# Patient Record
Sex: Female | Born: 1965 | Race: White | Hispanic: No | Marital: Married | Smoking: Never smoker
Health system: Southern US, Community
[De-identification: ages and names within clinical notes are randomized; demographics above are authoritative.]

## PROBLEM LIST (undated history)

## (undated) DIAGNOSIS — E559 Vitamin D deficiency, unspecified: Secondary | ICD-10-CM

## (undated) DIAGNOSIS — E039 Hypothyroidism, unspecified: Secondary | ICD-10-CM

## (undated) DIAGNOSIS — D649 Anemia, unspecified: Secondary | ICD-10-CM

## (undated) DIAGNOSIS — R160 Hepatomegaly, not elsewhere classified: Secondary | ICD-10-CM

## (undated) DIAGNOSIS — E119 Type 2 diabetes mellitus without complications: Secondary | ICD-10-CM

## (undated) DIAGNOSIS — L729 Follicular cyst of the skin and subcutaneous tissue, unspecified: Secondary | ICD-10-CM

## (undated) DIAGNOSIS — R768 Other specified abnormal immunological findings in serum: Secondary | ICD-10-CM

## (undated) DIAGNOSIS — L089 Local infection of the skin and subcutaneous tissue, unspecified: Secondary | ICD-10-CM

## (undated) DIAGNOSIS — M1990 Unspecified osteoarthritis, unspecified site: Secondary | ICD-10-CM

## (undated) DIAGNOSIS — R7689 Other specified abnormal immunological findings in serum: Secondary | ICD-10-CM

## (undated) DIAGNOSIS — Z8619 Personal history of other infectious and parasitic diseases: Secondary | ICD-10-CM

## (undated) HISTORY — DX: Local infection of the skin and subcutaneous tissue, unspecified: L08.9

## (undated) HISTORY — DX: Follicular cyst of the skin and subcutaneous tissue, unspecified: L72.9

## (undated) HISTORY — DX: Vitamin D deficiency, unspecified: E55.9

## (undated) HISTORY — DX: Other specified abnormal immunological findings in serum: R76.89

## (undated) HISTORY — DX: Type 2 diabetes mellitus without complications: E11.9

## (undated) HISTORY — DX: Hepatomegaly, not elsewhere classified: R16.0

## (undated) HISTORY — DX: Anemia, unspecified: D64.9

## (undated) HISTORY — DX: Unspecified osteoarthritis, unspecified site: M19.90

## (undated) HISTORY — DX: Personal history of other infectious and parasitic diseases: Z86.19

## (undated) HISTORY — DX: Other specified abnormal immunological findings in serum: R76.8

---

## 1997-02-25 DIAGNOSIS — L089 Local infection of the skin and subcutaneous tissue, unspecified: Secondary | ICD-10-CM

## 1997-02-25 HISTORY — DX: Local infection of the skin and subcutaneous tissue, unspecified: L08.9

## 1998-12-28 HISTORY — PX: OTHER SURGICAL HISTORY: SHX169

## 2001-02-25 HISTORY — PX: LIPOMA EXCISION: SHX5283

## 2005-04-03 ENCOUNTER — Ambulatory Visit: Payer: Self-pay

## 2005-04-11 ENCOUNTER — Ambulatory Visit: Payer: Self-pay | Admitting: Unknown Physician Specialty

## 2005-06-04 ENCOUNTER — Observation Stay: Payer: Self-pay | Admitting: Unknown Physician Specialty

## 2005-06-11 ENCOUNTER — Observation Stay: Payer: Self-pay | Admitting: Unknown Physician Specialty

## 2005-06-21 ENCOUNTER — Observation Stay: Payer: Self-pay | Admitting: Obstetrics & Gynecology

## 2005-06-28 ENCOUNTER — Observation Stay: Payer: Self-pay | Admitting: Unknown Physician Specialty

## 2005-07-03 ENCOUNTER — Inpatient Hospital Stay: Payer: Self-pay | Admitting: Unknown Physician Specialty

## 2005-07-11 ENCOUNTER — Ambulatory Visit: Payer: Self-pay | Admitting: Unknown Physician Specialty

## 2006-01-22 ENCOUNTER — Ambulatory Visit: Payer: Self-pay | Admitting: Unknown Physician Specialty

## 2007-03-19 ENCOUNTER — Ambulatory Visit: Payer: Self-pay | Admitting: Unknown Physician Specialty

## 2007-08-08 ENCOUNTER — Ambulatory Visit: Payer: Self-pay | Admitting: Rheumatology

## 2008-03-22 ENCOUNTER — Ambulatory Visit: Payer: Self-pay | Admitting: Unknown Physician Specialty

## 2009-03-23 ENCOUNTER — Ambulatory Visit: Payer: Self-pay | Admitting: Unknown Physician Specialty

## 2009-03-28 ENCOUNTER — Ambulatory Visit: Payer: Self-pay | Admitting: Unknown Physician Specialty

## 2010-04-04 ENCOUNTER — Ambulatory Visit: Payer: Self-pay | Admitting: Unknown Physician Specialty

## 2010-08-14 ENCOUNTER — Ambulatory Visit: Payer: Self-pay | Admitting: Family Medicine

## 2010-08-14 DIAGNOSIS — Z87898 Personal history of other specified conditions: Secondary | ICD-10-CM | POA: Insufficient documentation

## 2010-11-05 ENCOUNTER — Ambulatory Visit: Payer: Self-pay | Admitting: Orthopedic Surgery

## 2010-11-16 ENCOUNTER — Ambulatory Visit: Payer: Self-pay | Admitting: Family Medicine

## 2011-04-08 ENCOUNTER — Ambulatory Visit: Payer: Self-pay | Admitting: Unknown Physician Specialty

## 2012-03-03 LAB — HEPATIC FUNCTION PANEL: AST: 64 U/L — AB (ref 13–35)

## 2012-03-09 ENCOUNTER — Ambulatory Visit: Payer: Self-pay | Admitting: Family Medicine

## 2012-03-09 DIAGNOSIS — R160 Hepatomegaly, not elsewhere classified: Secondary | ICD-10-CM | POA: Insufficient documentation

## 2012-03-09 HISTORY — PX: OTHER SURGICAL HISTORY: SHX169

## 2013-11-29 ENCOUNTER — Encounter: Payer: Self-pay | Admitting: Podiatry

## 2013-11-29 ENCOUNTER — Ambulatory Visit (INDEPENDENT_AMBULATORY_CARE_PROVIDER_SITE_OTHER): Payer: 59

## 2013-11-29 ENCOUNTER — Ambulatory Visit (INDEPENDENT_AMBULATORY_CARE_PROVIDER_SITE_OTHER): Payer: 59 | Admitting: Podiatry

## 2013-11-29 VITALS — BP 148/89 | HR 88 | Resp 16 | Ht 60.0 in | Wt 200.0 lb

## 2013-11-29 DIAGNOSIS — B353 Tinea pedis: Secondary | ICD-10-CM

## 2013-11-29 DIAGNOSIS — M778 Other enthesopathies, not elsewhere classified: Secondary | ICD-10-CM

## 2013-11-29 DIAGNOSIS — M779 Enthesopathy, unspecified: Secondary | ICD-10-CM

## 2013-11-29 DIAGNOSIS — M7752 Other enthesopathy of left foot: Secondary | ICD-10-CM

## 2013-11-29 MED ORDER — TERBINAFINE HCL 250 MG PO TABS: 250.0000 mg | ORAL_TABLET | Freq: Every day | ORAL | Status: DC

## 2013-11-29 NOTE — Progress Notes (Signed)
   Subjective:    Patient ID: Kimberly Hancock, female    DOB: December 31, 1965, 48 y.o.   MRN: [id number]  HPI Comments: Both of my feet are itching. Its the toes. Ive had the itching for 1year. They are getting worse. All of the otc stuff does not work. im having pain under my toes on the left foot. Ive had the pain for 2 - 3 months. The pain has remained the same. When i first get up and walk i feel it. i dont do anything for the pain.     Review of Systems  Musculoskeletal:       Joint pain  All other systems reviewed and are negative.      Objective:   Physical Exam: I have reviewed her past medical history medications allergies surgeries social history and review of systems. Pulses are strongly palpable bilateral. Neurologic sensorium is intact per since once the monofilament. Deep tendon reflexes are intact bilateral muscle strength is 5 over 5 dorsiflexors plantar flexors inverters everters all intrinsic musculature is intact. Orthopedic evaluation demonstrates all joints distal to the ankle a full range of motion with out crepitation that she does have pain on end range of motion of the third metatarsophalangeal joint of the left foot. Cutaneous evaluation demonstrates supple well hydrated cutis no erythema edema cellulitis drainage or odor with exception of the right foot with an interdigital tinea pedis.        Assessment & Plan:  Assessment: Tinea pedis right foot. Capsulitis third metatarsophalangeal joint left foot.  Plan: Injected Kenalog about the third metatarsophalangeal joint of the left foot. Wrote her prescription for Lamisil tablets 250 mg 1 by mouth daily x30. Followup with her in one month.

## 2013-12-27 ENCOUNTER — Ambulatory Visit: Payer: 59 | Admitting: Podiatry

## 2013-12-29 ENCOUNTER — Ambulatory Visit: Payer: 59 | Admitting: Podiatry

## 2014-01-17 ENCOUNTER — Encounter: Payer: Self-pay | Admitting: Podiatry

## 2014-01-17 ENCOUNTER — Ambulatory Visit (INDEPENDENT_AMBULATORY_CARE_PROVIDER_SITE_OTHER): Payer: 59 | Admitting: Podiatry

## 2014-01-17 DIAGNOSIS — B353 Tinea pedis: Secondary | ICD-10-CM

## 2014-01-17 MED ORDER — CLOTRIMAZOLE-BETAMETHASONE 1-0.05 % EX CREA: 1.0000 "application " | TOPICAL_CREAM | Freq: Two times a day (BID) | CUTANEOUS | Status: DC

## 2014-01-17 NOTE — Progress Notes (Signed)
She presents today stating that the metatarsophalangeal joint left foot is doing much better. However she states that the itching has not gotten any better. She took a month's worth of the Lamisil forming and has not improved. She still has what appears to be tinea pedis on the plantar aspect and interdigital areas of her bilateral foot she also has excoriations dorsal aspect of the left foot.  Objective: Vital signs are stable she is alert and oriented times puritis with tinea pedis and resolving MTPJ pain.  Assessment: Well-healing capsulitis tinea pedis bilateral.  Plan: Wrote a prescription for Lotrisone to be applied twice daily follow-up with dermatology if no better.

## 2014-02-16 ENCOUNTER — Ambulatory Visit: Payer: 59 | Admitting: Podiatry

## 2014-05-17 ENCOUNTER — Ambulatory Visit: Payer: Self-pay | Admitting: Unknown Physician Specialty

## 2014-09-08 ENCOUNTER — Encounter: Payer: Self-pay | Admitting: *Deleted

## 2014-09-15 ENCOUNTER — Other Ambulatory Visit: Payer: 59

## 2014-09-15 ENCOUNTER — Ambulatory Visit (INDEPENDENT_AMBULATORY_CARE_PROVIDER_SITE_OTHER): Payer: 59 | Admitting: General Surgery

## 2014-09-15 ENCOUNTER — Encounter: Payer: Self-pay | Admitting: General Surgery

## 2014-09-15 VITALS — BP 152/82 | HR 86 | Resp 14 | Ht 60.0 in | Wt 206.0 lb

## 2014-09-15 DIAGNOSIS — N644 Mastodynia: Secondary | ICD-10-CM

## 2014-09-15 NOTE — Progress Notes (Signed)
Patient ID: Kimberly Hancock, female   DOB: 05-16-1965, 49 y.o.   MRN: [id number]  Chief Complaint  Patient presents with  . Breast Problem    HPI Kimberly Hancock is a 49 y.o. female.  who presents for a breast evaluation. The most recent mammogram was done on 05-17-14.  Patient does not perform regular self breast checks but she gets regular mammograms done.    She states she has noticed left breast pain for 3-4 weeks. Described as a stabbing pain, lasting 5-10 minutes, occurring occasionally at various times. When she dries off in the shower she can feel the pain. Physical activity does not change her symptoms. She has not been awakened from sleep due to breast discomfort. Denies any breast injury or trauma. No family history of breast cancer. She states she can't feel anything different but states Dr. Vernie Ammons could.  Denies any recent diet or caffeine intake changes.   HPI  Past Medical History  Diagnosis Date  . Infected cyst of skin 1999    Past Surgical History  Procedure Laterality Date  . Cesarean section  2007  . Lipoma excision  2003    mid back/ Dr Bary Castilla    Family History  Problem Relation Age of Onset  . Diabetes Mother   . Cancer Brother 30    bone passed age 53    Social History History  Substance Use Topics  . Smoking status: Never Smoker   . Smokeless tobacco: Never Used  . Alcohol Use: No    Allergies  Allergen Reactions  . Amoxicillin Itching  . Sulfa Antibiotics Itching and Swelling    Current Outpatient Prescriptions  Medication Sig Dispense Refill  . Vitamin D, Cholecalciferol, 1000 UNITS CAPS Take by mouth daily.    . vitamin E 400 UNIT capsule Take 400 Units by mouth daily.     No current facility-administered medications for this visit.    Review of Systems Review of Systems  Constitutional: Negative.   Respiratory: Negative.   Cardiovascular: Negative.     Blood pressure 152/82, pulse 86, resp. rate 14, height 5' (1.524 m),  weight 206 lb (93.441 kg), last menstrual period 08/31/2014.  Physical Exam Physical Exam  Constitutional: She is oriented to person, place, and time. She appears well-developed and well-nourished.  HENT:  Mouth/Throat: Oropharynx is clear and moist.  Eyes: Conjunctivae are normal. No scleral icterus.  Neck: Neck supple.  Cardiovascular: Normal rate, regular rhythm and normal heart sounds.   Pulmonary/Chest: Effort normal and breath sounds normal. Right breast exhibits no inverted nipple, no mass, no nipple discharge, no skin change and no tenderness. Left breast exhibits no inverted nipple, no mass, no nipple discharge, no skin change and no tenderness.  Thickening at 6 o'clock just below nipple left breast.  Lymphadenopathy:    She has no cervical adenopathy.    She has no axillary adenopathy.  Neurological: She is alert and oriented to person, place, and time.  Skin: Skin is warm and dry.  Psychiatric: She has a normal mood and affect.    Data Reviewed Screening mammogram completed at [facility] was not available for review. Reported asymmetry in the left breast.  Focal spot compression views and diagnostic studies completed at [facility] dated 05/17/2014 were reviewed. BI-RADS-1.  Ultrasound examination of the left breast especially in the retroareolar area and in the inferior aspect of the breast at the point of maximum discomfort showed hyperechoic tissue adjacent to the ductal structures in the upper-outer quadrant  and slightly prominent ductal structures measuring up to 0.55 cm. No intraductal lesions were noted. In the inferior aspect of the breast just below the nipple a hypoechoic area was appreciated that was poorly defined and difficult to visualize on radial imaging. On antiradial views just at the base the nipple a 1.4 x 1.6 cm hypoechoic area with focal areas of ductal dilatation more superiorly were appreciated. This may all represent inflammatory changes.  BI-RADS-3.  Assessment    Mastalgia, indeterminate clinical and ultrasound exam. Normal mammograms.    Plan    With the short interval of symptoms and the indeterminant findings on today's ultrasound I have asked the patient to make use of a trial of conservative therapy with anti-inflammatory and plan for repeat examination in one month.     Advised to try 2 Aleve twice a day for 2 weeks and follow up in office in one month.   PCP:  Birdie Sons Ref Dr Ricka Burdock 09/17/2014, 7:58 AM

## 2014-09-15 NOTE — Patient Instructions (Addendum)
The patient is aware to call back for any questions or concerns. Continue self breast exams. Call office for any new breast issues or concerns. Advised to try 2 Aleve twice a day for 2 weeks and follow up in office in one month.

## 2014-09-17 DIAGNOSIS — N644 Mastodynia: Secondary | ICD-10-CM | POA: Insufficient documentation

## 2014-10-17 ENCOUNTER — Encounter: Payer: Self-pay | Admitting: General Surgery

## 2014-10-17 ENCOUNTER — Ambulatory Visit (INDEPENDENT_AMBULATORY_CARE_PROVIDER_SITE_OTHER): Payer: 59 | Admitting: General Surgery

## 2014-10-17 VITALS — BP 120/78 | HR 84 | Resp 14 | Ht 60.0 in | Wt 207.0 lb

## 2014-10-17 DIAGNOSIS — N644 Mastodynia: Secondary | ICD-10-CM

## 2014-10-17 NOTE — Progress Notes (Signed)
Patient ID: Kimberly Hancock, female   DOB: [birth year], 49 y.o.   MRN: [id number]  Chief Complaint  Patient presents with  . Follow-up    Left Breast pain    HPI Kimberly Hancock is a 49 y.o. female here for follow up for left breast pain. She states that the pain has greatly improved. She reports she will only feel some discomfort once in a while now.  HPI  Past Medical History  Diagnosis Date  . Infected cyst of skin 1999  . Vitamin D deficiency     Past Surgical History  Procedure Laterality Date  . Cesarean section  2007  . Lipoma excision  2003    mid back/ Dr Bary Castilla    Family History  Problem Relation Age of Onset  . Diabetes Mother   . Cancer Brother 78    bone passed age 65    Social History Social History  Substance Use Topics  . Smoking status: Never Smoker   . Smokeless tobacco: Never Used  . Alcohol Use: No    Allergies  Allergen Reactions  . Amoxicillin Itching  . Sulfa Antibiotics Itching and Swelling    Current Outpatient Prescriptions  Medication Sig Dispense Refill  . Vitamin D, Cholecalciferol, 1000 UNITS CAPS Take by mouth daily.    . vitamin E 400 UNIT capsule Take 400 Units by mouth daily.     No current facility-administered medications for this visit.    Review of Systems Review of Systems  Constitutional: Negative.   Respiratory: Negative.   Cardiovascular: Negative.     Blood pressure 120/78, pulse 84, resp. rate 14, height 5' (1.524 m), weight 207 lb (93.895 kg), last menstrual period 09/24/2014.  Physical Exam Physical Exam  Constitutional: She is oriented to person, place, and time. She appears well-developed and well-nourished.  Pulmonary/Chest: Right breast exhibits no inverted nipple, no mass, no nipple discharge, no skin change and no tenderness. Left breast exhibits no inverted nipple, no mass, no nipple discharge, no skin change and no tenderness.    Neurological: She is alert and oriented to person, place, and time.   Skin: Skin is warm and dry.  Psychiatric: She has a normal mood and affect.     Assessment    Mastalgia, resolved.      Plan    The patient will decrease the previously prescribed and well-tolerated Aleve to 1 tablet twice a day for 1 week and then discontinue the medication. If she remains pain-free she will be asked to return in 6 months for reassessment of the hypoechoic area noted on ultrasound, likely adipose tissue based on today's exam.  Should she develop recurrent pain, she was asked to cough early assessment.    PCP: Lelon Huh Ref Dr Percell [place], [place] 10/18/2014, 11:26 AM

## 2015-01-09 ENCOUNTER — Encounter: Payer: Self-pay | Admitting: General Surgery

## 2015-02-08 ENCOUNTER — Encounter: Payer: Self-pay | Admitting: *Deleted

## 2015-04-13 ENCOUNTER — Other Ambulatory Visit: Payer: Self-pay

## 2015-04-13 ENCOUNTER — Ambulatory Visit (INDEPENDENT_AMBULATORY_CARE_PROVIDER_SITE_OTHER): Payer: 59 | Admitting: General Surgery

## 2015-04-13 ENCOUNTER — Encounter: Payer: Self-pay | Admitting: General Surgery

## 2015-04-13 VITALS — BP 140/78 | HR 76 | Resp 12 | Ht 60.0 in | Wt 187.0 lb

## 2015-04-13 DIAGNOSIS — N644 Mastodynia: Secondary | ICD-10-CM

## 2015-04-13 DIAGNOSIS — L02212 Cutaneous abscess of back [any part, except buttock]: Secondary | ICD-10-CM | POA: Diagnosis not present

## 2015-04-13 NOTE — Patient Instructions (Signed)
The patient is aware to call back for any questions or concerns.  

## 2015-04-13 NOTE — Progress Notes (Signed)
Patient ID: Kimberly Hancock, female   DOB: Feb 29, 1965, 50 y.o.   MRN: [id number]  Chief Complaint  Patient presents with  . Follow-up    HPI Kimberly Hancock is a 50 y.o. female.  Here today for 6 month follow up left breast that showed hypoechoic area on ultrasound. The pain that had prompted her initial exam in August 2016 as completely resolved with a short course of anti-inflammatory therapy. No further nipple drainage. No history of trauma.  She would like a spot on her upper back checked as well, she says it has an odor.  I personally reviewed the patient's history.   HPI  Past Medical History  Diagnosis Date  . Infected cyst of skin 1999  . Vitamin D deficiency     Past Surgical History  Procedure Laterality Date  . Cesarean section  2007  . Lipoma excision  2003    mid back/ Dr Bary Castilla    Family History  Problem Relation Age of Onset  . Diabetes Mother   . Cancer Brother 71    bone passed age 37    Social History Social History  Substance Use Topics  . Smoking status: Never Smoker   . Smokeless tobacco: Never Used  . Alcohol Use: No    Allergies  Allergen Reactions  . Amoxicillin Itching  . Sulfa Antibiotics Itching and Swelling    Current Outpatient Prescriptions  Medication Sig Dispense Refill  . Vitamin D, Cholecalciferol, 1000 UNITS CAPS Take by mouth daily.    . vitamin E 400 UNIT capsule Take 400 Units by mouth daily.     No current facility-administered medications for this visit.    Review of Systems Review of Systems  Constitutional: Negative.   Respiratory: Negative.   Cardiovascular: Negative.     Blood pressure 140/78, pulse 76, resp. rate 12, height 5' (1.524 m), weight 187 lb (84.823 kg), last menstrual period 03/19/2015.  Physical Exam Physical Exam  Constitutional: She is oriented to person, place, and time. She appears well-developed and well-nourished.  HENT:  Mouth/Throat: Oropharynx is clear and moist.  Eyes: Conjunctivae  are normal. No scleral icterus.  Neck: Neck supple.    Cardiovascular: Normal rate, regular rhythm and normal heart sounds.   Pulmonary/Chest: Effort normal and breath sounds normal. Right breast exhibits no inverted nipple, no mass, no nipple discharge, no skin change and no tenderness. Left breast exhibits no inverted nipple, no mass, no nipple discharge, no skin change and no tenderness.  Lymphadenopathy:    She has no cervical adenopathy.    She has no axillary adenopathy.  Neurological: She is alert and oriented to person, place, and time.  Skin: Skin is warm and dry.  Psychiatric: Her behavior is normal.    Data Reviewed Ultrasound examination of the left breast was undertaken to reassess the previously noted area of hypoechoic tissue in the nipple areolar complex. At the base of the nipple at the 6:00 position ill-defined hypoechoic area with acoustic shadowing measuring 0.4 x 0.6 x 0.9 cm is appreciated. Some focal areas of acoustic enhancement were noted. Minimal ductal dilatation appreciated in this area. No increased vascular flow. A cluster of cysts as noted at the 12:00 position 1 cm above the nipple measuring up to 0.6 cm in diameter. Angled view beneath the nipple from the 3:00 position showed modest ductal dilatation between 0.26 and 0.4 cm. No intraductal lesions were noted. The area at the 6:00 position a slightly more prominent than in July 2016.  BI-RADS-3.  Review of the 05/17/2014 screening and diagnostic mammograms were reviewed. BI-RADS-1..  The patient has mammograms pending next month.  Assessment    Abscess of back secondary sebaceous cyst.  Breast pain, resolved, indeterminant ultrasound abnormality.    Plan    The patient will notify the office when she has her upcoming mammograms completed so they can be independently reviewed. The ultrasound findings at present are still thought to be secondary to be inflammatory in nature and unless there is associated  mammographic abnormality would be reluctant to recommend biopsy at this time.  She underwent incision and drainage of the back cyst. Alcohol prep followed by 10 mL of 0.5% Xylocaine with 0.25% Marcaine with 1-200,000 units of epinephrine. ChloraPrep was applied. The area was excised through a 1 cm elliptical incision. The cyst wall was able to be extracted with the liquid center. Scant bleeding was noted. A dry dressing was applied followed by Telfa and Tegaderm.  Wound care instructions were reviewed. She may begin showering tomorrow. Antibiotics were not felt indicated based on clinical exam. No specimen was sent for pathologic review.  The patient will call if she has any difficulty with wound healing.     PCP:  Lelon Huh Ref Dr Vernie Ammons This information has been scribed by Karie Fetch [place].   Kimberly Hancock 04/14/2015, 8:18 AM

## 2015-04-14 DIAGNOSIS — L02212 Cutaneous abscess of back [any part, except buttock]: Secondary | ICD-10-CM | POA: Insufficient documentation

## 2015-04-17 ENCOUNTER — Other Ambulatory Visit: Payer: Self-pay | Admitting: Unknown Physician Specialty

## 2015-04-17 DIAGNOSIS — Z1231 Encounter for screening mammogram for malignant neoplasm of breast: Secondary | ICD-10-CM

## 2015-04-19 ENCOUNTER — Ambulatory Visit: Payer: Self-pay | Admitting: General Surgery

## 2015-05-11 ENCOUNTER — Ambulatory Visit
Admission: RE | Admit: 2015-05-11 | Discharge: 2015-05-11 | Disposition: A | Discharge status: Home / Self Care | Payer: 59 | Source: Ambulatory Visit | Attending: Unknown Physician Specialty | Admitting: Unknown Physician Specialty

## 2015-05-11 DIAGNOSIS — R921 Mammographic calcification found on diagnostic imaging of breast: Secondary | ICD-10-CM | POA: Insufficient documentation

## 2015-05-11 DIAGNOSIS — Z1231 Encounter for screening mammogram for malignant neoplasm of breast: Secondary | ICD-10-CM | POA: Diagnosis present

## 2015-05-12 ENCOUNTER — Telehealth: Payer: Self-pay | Admitting: General Surgery

## 2015-05-12 NOTE — Telephone Encounter (Signed)
PT CALLED TO LET YOU KNOW SHE HAD HER MAMMOGRAM AT [facility] 05-11-15.YOU HAD ASK HER TO CALL

## 2015-05-14 NOTE — Telephone Encounter (Signed)
2016 films from [facility] and current films reviewed. Area of calcifications in the UOQ of the right breast will require additional imaging.

## 2015-05-15 NOTE — Telephone Encounter (Signed)
Notified patient as instructed and she will contact the office after her mammogram is complete.

## 2015-05-16 ENCOUNTER — Ambulatory Visit
Admission: RE | Admit: 2015-05-16 | Discharge: 2015-05-16 | Disposition: A | Discharge status: Home / Self Care | Payer: 59 | Source: Ambulatory Visit | Attending: Unknown Physician Specialty | Admitting: Unknown Physician Specialty

## 2015-05-16 ENCOUNTER — Other Ambulatory Visit: Payer: Self-pay | Admitting: Unknown Physician Specialty

## 2015-05-16 DIAGNOSIS — R921 Mammographic calcification found on diagnostic imaging of breast: Secondary | ICD-10-CM | POA: Diagnosis not present

## 2015-05-16 LAB — BASIC METABOLIC PANEL
BUN: 10 mg/dL (ref 4–21)
CREATININE: 0.5 mg/dL (ref 0.5–1.1)
Glucose: 99 mg/dL
POTASSIUM: 4.8 mmol/L (ref 3.4–5.3)
SODIUM: 140 mmol/L (ref 137–147)

## 2015-05-16 LAB — LIPID PANEL
CHOLESTEROL: 188 mg/dL (ref 0–200)
HDL: 53 mg/dL (ref 35–70)
LDL Cholesterol: 112 mg/dL
Triglycerides: 113 mg/dL (ref 40–160)

## 2015-05-16 LAB — CBC AND DIFFERENTIAL
HCT: 41 % (ref 36–46)
HEMOGLOBIN: 13.7 g/dL (ref 12.0–16.0)
Platelets: 241 10*3/uL (ref 150–399)
WBC: 6.9 10^3/mL

## 2015-05-16 LAB — HEPATIC FUNCTION PANEL
ALT: 38 U/L — AB (ref 7–35)
AST: 29 U/L (ref 13–35)

## 2015-05-16 LAB — HEMOGLOBIN A1C: Hemoglobin A1C: 5.7

## 2015-05-30 ENCOUNTER — Ambulatory Visit: Payer: Self-pay | Admitting: General Surgery

## 2015-06-01 ENCOUNTER — Ambulatory Visit (INDEPENDENT_AMBULATORY_CARE_PROVIDER_SITE_OTHER): Payer: 59 | Admitting: General Surgery

## 2015-06-01 ENCOUNTER — Encounter: Payer: Self-pay | Admitting: General Surgery

## 2015-06-01 VITALS — BP 158/88 | HR 82 | Resp 12 | Ht 62.0 in | Wt 190.0 lb

## 2015-06-01 DIAGNOSIS — Z1211 Encounter for screening for malignant neoplasm of colon: Secondary | ICD-10-CM | POA: Insufficient documentation

## 2015-06-01 MED ORDER — POLYETHYLENE GLYCOL 3350 17 GM/SCOOP PO POWD: ORAL | Status: DC

## 2015-06-01 NOTE — Patient Instructions (Addendum)
Colonoscopy A colonoscopy is an exam to look at the entire large intestine (colon). This exam can help find problems such as tumors, polyps, inflammation, and areas of bleeding. The exam takes about 1 hour.  LET [facility] CARE PROVIDER KNOW ABOUT:   Any allergies you have.  All medicines you are taking, including vitamins, herbs, eye drops, creams, and over-the-counter medicines.  Previous problems you or members of your family have had with the use of anesthetics.  Any blood disorders you have.  Previous surgeries you have had.  Medical conditions you have. RISKS AND COMPLICATIONS  Generally, this is a safe procedure. However, as with any procedure, complications can occur. Possible complications include:  Bleeding.  Tearing or rupture of the colon wall.  Reaction to medicines given during the exam.  Infection (rare). BEFORE THE PROCEDURE   Ask your health care provider about changing or stopping your regular medicines.  You may be prescribed an oral bowel prep. This involves drinking a large amount of medicated liquid, starting the day before your procedure. The liquid will cause you to have multiple loose stools until your stool is almost clear or light green. This cleans out your colon in preparation for the procedure.  Do not eat or drink anything else once you have started the bowel prep, unless your health care provider tells you it is safe to do so.  Arrange for someone to drive you home after the procedure. PROCEDURE   You will be given medicine to help you relax (sedative).  You will lie on your side with your knees bent.  A long, flexible tube with a light and camera on the end (colonoscope) will be inserted through the rectum and into the colon. The camera sends video back to a computer screen as it moves through the colon. The colonoscope also releases carbon dioxide gas to inflate the colon. This helps your health care provider see the area better.  During  the exam, your health care provider may take a small tissue sample (biopsy) to be examined under a microscope if any abnormalities are found.  The exam is finished when the entire colon has been viewed. AFTER THE PROCEDURE   Do not drive for 24 hours after the exam.  You may have a small amount of blood in your stool.  You may pass moderate amounts of gas and have mild abdominal cramping or bloating. This is caused by the gas used to inflate your colon during the exam.  Ask when your test results will be ready and how you will get your results. Make sure you get your test results.   This information is not intended to replace advice given to you by your health care provider. Make sure you discuss any questions you have with your health care provider.   Document Released: 02/09/2000 Document Revised: 12/02/2012 Document Reviewed: 10/19/2012 Elsevier Interactive Patient Education [employer].  Patient has been scheduled for a colonoscopy on 06-28-15 at [facility].

## 2015-06-01 NOTE — Progress Notes (Signed)
Patient ID: Kimberly Hancock, female   DOB: Apr 20, 1965, 50 y.o.   MRN: [id number]  Chief Complaint  Patient presents with  . Colonoscopy    HPI Kimberly Hancock is a 50 y.o. female here today for a evaluation of a screening colonoscopy. No GI problems at this time.   I personally reviewed the patient's history. HPI  Past Medical History  Diagnosis Date  . Infected cyst of skin 1999  . Vitamin D deficiency     Past Surgical History  Procedure Laterality Date  . Cesarean section  2007  . Lipoma excision  2003    mid back/ Dr Bary Castilla    Family History  Problem Relation Age of Onset  . Diabetes Mother   . Cancer Brother 105    bone passed age 54    Social History Social History  Substance Use Topics  . Smoking status: Never Smoker   . Smokeless tobacco: Never Used  . Alcohol Use: No    Allergies  Allergen Reactions  . Amoxicillin Itching  . Sulfa Antibiotics Itching and Swelling    Current Outpatient Prescriptions  Medication Sig Dispense Refill  . polyethylene glycol powder (GLYCOLAX/MIRALAX) powder 255 grams one bottle for colonoscopy prep 255 g 0  . Vitamin D, Cholecalciferol, 1000 UNITS CAPS Take by mouth daily.    . vitamin E 400 UNIT capsule Take 400 Units by mouth daily.     No current facility-administered medications for this visit.    Review of Systems Review of Systems  Respiratory: Negative.   Cardiovascular: Negative.     Blood pressure 158/88, pulse 82, resp. rate 12, height 5\' 2"  (1.575 m), weight 190 lb (86.183 kg), last menstrual period 05/29/2015.  Physical Exam Physical Exam  Constitutional: She is oriented to person, place, and time. She appears well-developed and well-nourished.  Eyes: Conjunctivae are normal. No scleral icterus.  Neck: Neck supple.  Cardiovascular: Normal rate, regular rhythm and normal heart sounds.   Pulmonary/Chest: Effort normal and breath sounds normal.  Lymphadenopathy:    She has no cervical adenopathy.   Neurological: She is alert and oriented to person, place, and time.  Skin: Skin is warm and dry.    Data Reviewed GYN notes dated 05/16/2015 were reviewed. Stool Hemoccult was negative.  Laboratory studies showed a CBC with a hemoglobin of 13.7, MCV 86, white blood cell count 6900. Creatinine 0.5 with an estimated GFR of 113. Normal electrolytes and liver function studies. Low vitamin D level.  Assessment    Candidate for screening colonoscopy.    Plan          Colonoscopy with possible biopsy/polypectomy prn: Information regarding the procedure, including its potential risks and complications (including but not limited to perforation of the bowel, which may require emergency surgery to repair, and bleeding) was verbally given to the patient. Educational information regarding lower intestinal endoscopy was given to the patient. Written instructions for how to complete the bowel prep using Miralax were provided. The importance of drinking ample fluids to avoid dehydration as a result of the prep emphasized.  Patient has been scheduled for a colonoscopy on 06-28-15 at [facility].   PCP:  Caryn Section,  This information has been scribed by Gaspar Cola CMA.   Robert Bellow 06/01/2015, 2:14 PM

## 2015-06-02 ENCOUNTER — Telehealth: Payer: Self-pay | Admitting: Family Medicine

## 2015-06-06 NOTE — Telephone Encounter (Signed)
Opened in error

## 2015-06-07 DIAGNOSIS — E669 Obesity, unspecified: Secondary | ICD-10-CM | POA: Insufficient documentation

## 2015-06-07 DIAGNOSIS — D696 Thrombocytopenia, unspecified: Secondary | ICD-10-CM | POA: Insufficient documentation

## 2015-06-07 DIAGNOSIS — M1990 Unspecified osteoarthritis, unspecified site: Secondary | ICD-10-CM | POA: Insufficient documentation

## 2015-06-07 DIAGNOSIS — Z8632 Personal history of gestational diabetes: Secondary | ICD-10-CM | POA: Insufficient documentation

## 2015-06-07 DIAGNOSIS — O99119 Other diseases of the blood and blood-forming organs and certain disorders involving the immune mechanism complicating pregnancy, unspecified trimester: Secondary | ICD-10-CM

## 2015-06-07 DIAGNOSIS — R7989 Other specified abnormal findings of blood chemistry: Secondary | ICD-10-CM | POA: Insufficient documentation

## 2015-06-07 DIAGNOSIS — R945 Abnormal results of liver function studies: Secondary | ICD-10-CM

## 2015-06-07 HISTORY — DX: Personal history of gestational diabetes: Z86.32

## 2015-06-15 ENCOUNTER — Encounter: Payer: Self-pay | Admitting: Family Medicine

## 2015-06-15 ENCOUNTER — Ambulatory Visit (INDEPENDENT_AMBULATORY_CARE_PROVIDER_SITE_OTHER): Payer: 59 | Admitting: Family Medicine

## 2015-06-15 VITALS — BP 122/72 | HR 81 | Temp 98.8°F | Resp 16 | Wt 191.0 lb

## 2015-06-15 DIAGNOSIS — E0590 Thyrotoxicosis, unspecified without thyrotoxic crisis or storm: Secondary | ICD-10-CM | POA: Diagnosis not present

## 2015-06-15 DIAGNOSIS — H6120 Impacted cerumen, unspecified ear: Secondary | ICD-10-CM

## 2015-06-15 DIAGNOSIS — E0500 Thyrotoxicosis with diffuse goiter without thyrotoxic crisis or storm: Secondary | ICD-10-CM | POA: Insufficient documentation

## 2015-06-15 NOTE — Progress Notes (Signed)
       Patient: Kimberly Hancock Female    DOB: 07-26-1965   50 y.o.   MRN: [id number] Visit Date: 06/15/2015  Today's Provider: Lelon Huh, MD   Chief Complaint  Patient presents with  . Follow-up  . Abnormal Lab  . Ear Pain   Subjective:    HPI Abnormal Labs:  Patient is here  Today to discuss results from recent labs done through ordered by Dr. Ammie Dalton in March. She was seen for routine physical with no symptoms and incidentally found to have TSH<0.006 and T4=13.7 (n<12.0), normal t3 uptake and FTI. Normal CMP and CBC. She specifically denies change in appetite, energy level, heart racing, palpitations, nervousness, insomnia and GI changes. She states she has had her tubes tied and there is no chance that she is pregnant.   Ear Pain:  Patient states he has had pain in her right ear for several weeks. Pain is described as a dull ache. Patient has also had itching and a full sensation in her right ear.  Patient has not tried any treatment to relieve pain. Ear pain is unchanged since onset.      Allergies  Allergen Reactions  . Amoxicillin Itching  . Sulfa Antibiotics Itching and Swelling   Previous Medications   VITAMIN D, CHOLECALCIFEROL, 1000 UNITS CAPS    Take by mouth daily.   VITAMIN E 400 UNIT CAPSULE    Take 400 Units by mouth daily.    Review of Systems  Constitutional: Negative for fever, chills, appetite change and fatigue.  HENT: Positive for ear pain (right ear x 3 weeks).   Respiratory: Negative for chest tightness and shortness of breath.   Cardiovascular: Negative for chest pain and palpitations.  Gastrointestinal: Negative for nausea, vomiting and abdominal pain.  Neurological: Negative for dizziness and weakness.    Social History  Substance Use Topics  . Smoking status: Never Smoker   . Smokeless tobacco: Never Used  . Alcohol Use: No   Objective:   BP 122/72 mmHg  Pulse 81  Temp(Src) 98.8 F (37.1 C) (Oral)  Resp 16  Wt 191 lb (86.637 kg)   SpO2 98%  LMP 05/29/2015  Physical Exam  General Appearance:    Alert, cooperative, no distress, obese  HENT:   Both ear canal obstructed with cerumen. Right ear irrigated until cleared. TM non-inflamed, but small amount clear fluid seen behind TM  Eyes:    PERRL, conjunctiva/corneas clear, EOM's intact       Lungs:     Clear to auscultation bilaterally, respirations unlabored  Heart:    Regular rate and rhythm  Neurologic:   Awake, alert, oriented x 3. No apparent focal neurological           defect.   Neck:    No thyroid enlargement or nodules appreciated.        Assessment & Plan:     1. Hyperthyroidism Asymptomatic, incidental finding on routine labs.  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 Cerumen impaction, unspecified laterality Irrigated right ear canal until clear.        Lelon Huh, MD  [facility]

## 2015-06-16 ENCOUNTER — Other Ambulatory Visit: Payer: Self-pay | Admitting: Family Medicine

## 2015-06-16 ENCOUNTER — Encounter: Payer: Self-pay | Admitting: Family Medicine

## 2015-06-16 DIAGNOSIS — E0500 Thyrotoxicosis with diffuse goiter without thyrotoxic crisis or storm: Secondary | ICD-10-CM

## 2015-06-16 LAB — T4 AND TSH
T4 TOTAL: 10.4 ug/dL (ref 4.5–12.0)
TSH: <0.006 u[IU]/mL — ABNORMAL LOW (ref 0.450–4.500)

## 2015-06-16 LAB — THYROTROPIN RECEPTOR AUTOABS: THYROTROPIN RECEPTOR AB: 3.8 IU/L — AB (ref 0.00–1.75)

## 2015-06-16 NOTE — Progress Notes (Unsigned)
Please refer endocrinology for grave's disease

## 2015-06-27 ENCOUNTER — Encounter: Payer: Self-pay | Admitting: *Deleted

## 2015-06-28 ENCOUNTER — Ambulatory Visit: Payer: 59 | Admitting: *Deleted

## 2015-06-28 ENCOUNTER — Ambulatory Visit
Admission: RE | Admit: 2015-06-28 | Discharge: 2015-06-28 | Disposition: A | Discharge status: Home / Self Care | Payer: 59 | Source: Ambulatory Visit | Attending: General Surgery | Admitting: General Surgery

## 2015-06-28 ENCOUNTER — Encounter
Admission: RE | Disposition: A | Discharge status: Home / Self Care | Payer: Self-pay | Source: Ambulatory Visit | Attending: General Surgery

## 2015-06-28 ENCOUNTER — Encounter: Payer: Self-pay | Admitting: *Deleted

## 2015-06-28 DIAGNOSIS — E559 Vitamin D deficiency, unspecified: Secondary | ICD-10-CM | POA: Diagnosis not present

## 2015-06-28 DIAGNOSIS — Z808 Family history of malignant neoplasm of other organs or systems: Secondary | ICD-10-CM | POA: Insufficient documentation

## 2015-06-28 DIAGNOSIS — Z1211 Encounter for screening for malignant neoplasm of colon: Secondary | ICD-10-CM | POA: Insufficient documentation

## 2015-06-28 DIAGNOSIS — K5730 Diverticulosis of large intestine without perforation or abscess without bleeding: Secondary | ICD-10-CM | POA: Insufficient documentation

## 2015-06-28 HISTORY — DX: Hypothyroidism, unspecified: E03.9

## 2015-06-28 HISTORY — PX: COLONOSCOPY WITH PROPOFOL: SHX5780

## 2015-06-28 SURGERY — COLONOSCOPY WITH PROPOFOL
Anesthesia: General

## 2015-06-28 MED ORDER — SODIUM CHLORIDE 0.9 % IV SOLN
INTRAVENOUS | Status: DC
  Administered 2015-06-28: 1000 mL via INTRAVENOUS

## 2015-06-28 MED ORDER — PROPOFOL 500 MG/50ML IV EMUL
INTRAVENOUS | Status: DC | PRN
  Administered 2015-06-28: 100 ug/kg/min via INTRAVENOUS

## 2015-06-28 NOTE — Transfer of Care (Signed)
Immediate Anesthesia Transfer of Care Note  Patient: Kimberly Hancock  Procedure(s) Performed: Procedure(s): COLONOSCOPY WITH PROPOFOL (N/A)  Patient Location: PACU  Anesthesia Type:General  Level of Consciousness: awake, alert  and oriented  Airway & Oxygen Therapy: Patient Spontanous Breathing and Patient connected to nasal cannula oxygen  Post-op Assessment: Report given to RN and Post -op Vital signs reviewed and stable  Post vital signs: Reviewed and stable  Last Vitals:  Filed Vitals:   06/28/15 1011 06/28/15 1110  BP: 147/84 141/73  Pulse: 79 82  Temp: 36.8 C 36.8 C  Resp: 16 17    Last Pain: There were no vitals filed for this visit.       Complications: No apparent anesthesia complications

## 2015-06-28 NOTE — H&P (Signed)
No change in clinical history or exam.     For screening colonoscopy.  

## 2015-06-28 NOTE — Anesthesia Postprocedure Evaluation (Signed)
Anesthesia Post Note  Patient: Kimberly Hancock  Procedure(s) Performed: Procedure(s) (LRB): COLONOSCOPY WITH PROPOFOL (N/A)  Patient location during evaluation: Endoscopy Anesthesia Type: General Level of consciousness: awake and alert Pain management: pain level controlled Vital Signs Assessment: post-procedure vital signs reviewed and stable Respiratory status: spontaneous breathing, nonlabored ventilation, respiratory function stable and patient connected to nasal cannula oxygen Cardiovascular status: blood pressure returned to baseline and stable Postop Assessment: no signs of nausea or vomiting Anesthetic complications: no    Last Vitals:  Filed Vitals:   06/28/15 1130 06/28/15 1140  BP: 125/66 112/62  Pulse: 80 72  Temp:    Resp: 13 17    Last Pain: There were no vitals filed for this visit.               Martha Clan

## 2015-06-28 NOTE — Anesthesia Preprocedure Evaluation (Addendum)
Anesthesia Evaluation  Patient identified by MRN, date of birth, ID band Patient awake    Reviewed: Allergy & Precautions, H&P , NPO status , Patient's Chart, lab work & pertinent test results, reviewed documented beta blocker date and time   History of Anesthesia Complications Negative for: history of anesthetic complications  Airway Mallampati: III  TM Distance: >3 FB Neck ROM: full    Dental no notable dental hx. (+) Caps, Teeth Intact   Pulmonary neg pulmonary ROS,    Pulmonary exam normal breath sounds clear to auscultation       Cardiovascular Exercise Tolerance: Good negative cardio ROS Normal cardiovascular exam Rhythm:regular Rate:Normal     Neuro/Psych negative neurological ROS  negative psych ROS   GI/Hepatic negative GI ROS, Hepatomegaly   Endo/Other  neg diabetesHypothyroidism   Renal/GU negative Renal ROS  negative genitourinary   Musculoskeletal   Abdominal   Peds  Hematology negative hematology ROS (+)   Anesthesia Other Findings Past Medical History:   Infected cyst of skin                           1999         Vitamin D deficiency                                         History of chicken pox                                       Hepatomegaly                                                 ANA positive                                                 Osteoarthritis                                               Hypothyroidism                                               Reproductive/Obstetrics negative OB ROS                             Anesthesia Physical Anesthesia Plan  ASA: II  Anesthesia Plan: General   Post-op Pain Management:    Induction:   Airway Management Planned:   Additional Equipment:   Intra-op Plan:   Post-operative Plan:   Informed Consent: I have reviewed the patients History and Physical, chart, labs and discussed the procedure  including the risks, benefits and alternatives for the proposed anesthesia with the patient or authorized representative who has indicated his/her understanding and acceptance.  Dental Advisory Given  Plan Discussed with: Anesthesiologist, CRNA and Surgeon  Anesthesia Plan Comments:         Anesthesia Quick Evaluation

## 2015-06-28 NOTE — Op Note (Signed)
[facility] Patient Name: Kimberly Hancock Procedure Date: 06/28/2015 10:31 AM MRN: [id number] Account #: [id number] Date of Birth: December 10, 1965 Admit Type: Outpatient Age: 50 Room: [facility] ENDO ROOM 1 Gender: Female Note Status: Finalized Procedure:            Colonoscopy Indications:          Screening for colorectal malignant neoplasm Providers:            Robert Bellow, MD Referring MD:         Kirstie Peri. Caryn Section, MD (Referring MD) Medicines:            Monitored Anesthesia Care Complications:        No immediate complications. Procedure:            Pre-Anesthesia Assessment:                       - Prior to the procedure, a History and Physical was                        performed, and patient medications, allergies and                        sensitivities were reviewed. The patient's tolerance of                        previous anesthesia was reviewed.                       - The risks and benefits of the procedure and the                        sedation options and risks were discussed with the                        patient. All questions were answered and informed                        consent was obtained.                       After obtaining informed consent, the colonoscope was                        passed under direct vision. Throughout the procedure,                        the patient's blood pressure, pulse, and oxygen                        saturations were monitored continuously. The                        Colonoscope was introduced through the anus and                        advanced to the the terminal ileum. The colonoscopy was                        somewhat difficult due to significant looping and a  tortuous colon. Successful completion of the procedure                        was aided by changing the patient to a supine position.                        The patient tolerated the procedure well. The quality                         of the bowel preparation was excellent. Findings:      A few medium-mouthed diverticula were found in the sigmoid colon.      The retroflexed view of the distal rectum and anal verge was normal and       showed no anal or rectal abnormalities. Impression:           - Diverticulosis in the sigmoid colon.                       - The distal rectum and anal verge are normal on                        retroflexion view.                       - No specimens collected. Recommendation:       - Repeat colonoscopy in 10 years for screening purposes. Procedure Code(s):    --- Professional ---                       [phone], Colonoscopy, flexible; diagnostic, including                        collection of specimen(s) by brushing or washing, when                        performed (separate procedure) Diagnosis Code(s):    --- Professional ---                       K57.30, Diverticulosis of large intestine without                        perforation or abscess without bleeding                       Z12.11, Encounter for screening for malignant neoplasm                        of colon CPT copyright 2016 American Medical Association. All rights reserved. The codes documented in this report are preliminary and upon coder review may  be revised to meet current compliance requirements. Robert Bellow, MD 06/28/2015 11:09:13 AM This report has been signed electronically. Number of Addenda: 0 Note Initiated On: 06/28/2015 10:31 AM Scope Withdrawal Time: 0 hours 7 minutes 40 seconds  Total Procedure Duration: 0 hours 27 minutes 11 seconds       [facility]

## 2015-06-30 ENCOUNTER — Encounter: Payer: Self-pay | Admitting: General Surgery

## 2015-10-09 ENCOUNTER — Other Ambulatory Visit: Payer: Self-pay | Admitting: Obstetrics and Gynecology

## 2015-10-09 DIAGNOSIS — R921 Mammographic calcification found on diagnostic imaging of breast: Secondary | ICD-10-CM

## 2015-11-17 ENCOUNTER — Ambulatory Visit
Admission: RE | Admit: 2015-11-17 | Discharge: 2015-11-17 | Disposition: A | Discharge status: Home / Self Care | Payer: 59 | Source: Ambulatory Visit | Attending: Obstetrics and Gynecology | Admitting: Obstetrics and Gynecology

## 2015-11-17 DIAGNOSIS — R921 Mammographic calcification found on diagnostic imaging of breast: Secondary | ICD-10-CM | POA: Diagnosis not present

## 2015-12-06 ENCOUNTER — Other Ambulatory Visit: Payer: Self-pay | Admitting: Obstetrics and Gynecology

## 2015-12-06 DIAGNOSIS — Z1231 Encounter for screening mammogram for malignant neoplasm of breast: Secondary | ICD-10-CM

## 2015-12-06 DIAGNOSIS — R921 Mammographic calcification found on diagnostic imaging of breast: Secondary | ICD-10-CM

## 2016-05-14 ENCOUNTER — Ambulatory Visit
Admission: RE | Admit: 2016-05-14 | Discharge: 2016-05-14 | Disposition: A | Discharge status: Home / Self Care | Payer: 59 | Source: Ambulatory Visit | Attending: Obstetrics and Gynecology | Admitting: Obstetrics and Gynecology

## 2016-05-14 ENCOUNTER — Other Ambulatory Visit: Payer: 59

## 2016-05-14 ENCOUNTER — Ambulatory Visit: Payer: 59

## 2016-05-14 DIAGNOSIS — R921 Mammographic calcification found on diagnostic imaging of breast: Secondary | ICD-10-CM

## 2016-05-14 DIAGNOSIS — Z1231 Encounter for screening mammogram for malignant neoplasm of breast: Secondary | ICD-10-CM

## 2016-05-14 DIAGNOSIS — N6311 Unspecified lump in the right breast, upper outer quadrant: Secondary | ICD-10-CM

## 2016-05-28 ENCOUNTER — Encounter: Payer: Self-pay | Admitting: Obstetrics and Gynecology

## 2016-09-30 ENCOUNTER — Other Ambulatory Visit: Payer: Self-pay | Admitting: Obstetrics and Gynecology

## 2016-09-30 ENCOUNTER — Telehealth: Payer: Self-pay

## 2016-09-30 DIAGNOSIS — R928 Other abnormal and inconclusive findings on diagnostic imaging of breast: Secondary | ICD-10-CM

## 2016-09-30 DIAGNOSIS — Z1239 Encounter for other screening for malignant neoplasm of breast: Secondary | ICD-10-CM

## 2016-09-30 NOTE — Telephone Encounter (Signed)
Pt states she contacted [facility] and 6 month mammogram order needs to be for diagnostic instead of screening. Please update and I will notify pt. Thank you!

## 2016-09-30 NOTE — Telephone Encounter (Signed)
Diagnostic right mammo and ultrasound order are in

## 2016-09-30 NOTE — Telephone Encounter (Signed)
Pt called Kimberly Hancock to schedule 6 month mammogram and was told she needed an order from her provider. Advised pt that order was placed in April but according to [facility] no order found. Please place updated order and we can notify pt. Thank you.

## 2016-09-30 NOTE — Telephone Encounter (Signed)
Order is in.

## 2016-09-30 NOTE — Telephone Encounter (Signed)
Pt aware and will call [facility] to schedule.

## 2016-10-01 NOTE — Telephone Encounter (Signed)
Pt aware.

## 2016-11-18 ENCOUNTER — Encounter: Payer: Self-pay | Admitting: Obstetrics and Gynecology

## 2016-11-18 ENCOUNTER — Ambulatory Visit
Admission: RE | Admit: 2016-11-18 | Discharge: 2016-11-18 | Disposition: A | Discharge status: Home / Self Care | Payer: 59 | Source: Ambulatory Visit | Attending: Obstetrics and Gynecology | Admitting: Obstetrics and Gynecology

## 2016-11-18 DIAGNOSIS — R928 Other abnormal and inconclusive findings on diagnostic imaging of breast: Secondary | ICD-10-CM

## 2016-11-18 DIAGNOSIS — R921 Mammographic calcification found on diagnostic imaging of breast: Secondary | ICD-10-CM | POA: Diagnosis not present

## 2017-05-06 ENCOUNTER — Encounter: Payer: 59 | Admitting: Obstetrics and Gynecology

## 2017-05-26 LAB — HM PAP SMEAR: HM Pap smear: NORMAL

## 2017-06-11 ENCOUNTER — Ambulatory Visit
Admission: RE | Admit: 2017-06-11 | Discharge: 2017-06-11 | Disposition: A | Discharge status: Home / Self Care | Payer: 59 | Source: Ambulatory Visit | Attending: Obstetrics and Gynecology | Admitting: Obstetrics and Gynecology

## 2017-06-11 ENCOUNTER — Encounter (INDEPENDENT_AMBULATORY_CARE_PROVIDER_SITE_OTHER): Payer: Self-pay

## 2017-06-11 DIAGNOSIS — R921 Mammographic calcification found on diagnostic imaging of breast: Secondary | ICD-10-CM | POA: Diagnosis not present

## 2017-06-11 DIAGNOSIS — R928 Other abnormal and inconclusive findings on diagnostic imaging of breast: Secondary | ICD-10-CM | POA: Diagnosis not present

## 2017-09-22 ENCOUNTER — Encounter: Payer: Self-pay | Admitting: Physician Assistant

## 2017-09-22 ENCOUNTER — Ambulatory Visit (INDEPENDENT_AMBULATORY_CARE_PROVIDER_SITE_OTHER): Payer: 59 | Admitting: Physician Assistant

## 2017-09-22 VITALS — BP 140/90 | HR 87 | Temp 98.4°F | Resp 16 | Wt 204.4 lb

## 2017-09-22 DIAGNOSIS — R590 Localized enlarged lymph nodes: Secondary | ICD-10-CM

## 2017-09-22 DIAGNOSIS — H6123 Impacted cerumen, bilateral: Secondary | ICD-10-CM | POA: Diagnosis not present

## 2017-09-22 MED ORDER — DOXYCYCLINE HYCLATE 100 MG PO TABS
100.0000 mg | ORAL_TABLET | Freq: Two times a day (BID) | ORAL | 0 refills | Status: DC
Start: 2017-09-22 — End: 2018-02-04

## 2017-09-22 NOTE — Patient Instructions (Signed)
Lymphadenopathy Lymphadenopathy refers to swollen or enlarged lymph glands, also called lymph nodes. Lymph glands are part of your body's defense (immune) system, which protects the body from infections, germs, and diseases. Lymph glands are found in many locations in your body, including the neck, underarm, and groin. Many things can cause lymph glands to become enlarged. When your immune system responds to germs, such as viruses or bacteria, infection-fighting cells and fluid build up. This causes the glands to grow in size. Usually, this is not something to worry about. The swelling and any soreness often go away without treatment. However, swollen lymph glands can also be caused by a number of diseases. Your health care provider may do various tests to help determine the cause. If the cause of your swollen lymph glands cannot be found, it is important to monitor your condition to make sure the swelling goes away. Follow these instructions at home: Watch your condition for any changes. The following actions may help to lessen any discomfort you are feeling:  Get plenty of rest.  Take medicines only as directed by your health care provider. Your health care provider may recommend over-the-counter medicines for pain.  Apply moist heat compresses to the site of swollen lymph nodes as directed by your health care provider. This can help reduce any pain.  Check your lymph nodes daily for any changes.  Keep all follow-up visits as directed by your health care provider. This is important.  Contact a health care provider if:  Your lymph nodes are still swollen after 2 weeks.  Your swelling increases or spreads to other areas.  Your lymph nodes are hard, seem fixed to the skin, or are growing rapidly.  Your skin over the lymph nodes is red and inflamed.  You have a fever.  You have chills.  You have fatigue.  You develop a sore throat.  You have abdominal pain.  You have weight  loss.  You have night sweats. Get help right away if:  You notice fluid leaking from the area of the enlarged lymph node.  You have severe pain in any area of your body.  You have chest pain.  You have shortness of breath. This information is not intended to replace advice given to you by your health care provider. Make sure you discuss any questions you have with your health care provider. Document Released: 11/21/2007 Document Revised: 07/20/2015 Document Reviewed: 09/16/2013 Elsevier Interactive Patient Education  2018 Elsevier Inc.  

## 2017-09-22 NOTE — Progress Notes (Signed)
Patient: Kimberly Hancock Female    DOB: 01-13-1966   52 y.o.   MRN: [id number] Visit Date: 09/22/2017  Today's Provider: Mar Daring, PA-C   Chief Complaint  Patient presents with  . Lymphadenopathy   Subjective:    HPI Patient here today with lump behind her ears. She reports that on Thursday behind her left ear and felt a lump and reports that now she feels the same lump on both sides and feels tender and it hurts even with out touching it. She has noticed some ear pain as well bilaterally but not severe.     Allergies  Allergen Reactions  . Amoxicillin Itching  . Sulfa Antibiotics Itching and Swelling     Current Outpatient Medications:  .  cyanocobalamin (,VITAMIN B-12,) 1000 MCG/ML injection, Inject into the muscle., Disp: , Rfl:  .  Vitamin D, Ergocalciferol, (DRISDOL) 50000 units CAPS capsule, TAKE ONE CAPSULE BY MOUTH ONE TIME PER WEEK, Disp: , Rfl:   Review of Systems  Constitutional: Negative.   HENT: Positive for ear pain. Negative for congestion, ear discharge, postnasal drip, rhinorrhea, sinus pressure, sinus pain, sneezing and sore throat.   Respiratory: Negative for cough, chest tightness and shortness of breath.   Cardiovascular: Negative for chest pain, palpitations and leg swelling.  Gastrointestinal: Negative for abdominal pain.  Neurological: Positive for headaches. Negative for dizziness and light-headedness.    Social History   Tobacco Use  . Smoking status: Never Smoker  . Smokeless tobacco: Never Used  Substance Use Topics  . Alcohol use: No    Alcohol/week: 0.0 oz   Objective:   BP 140/90 (BP Location: Left Arm, Patient Position: Sitting, Cuff Size: Large) Comment: Reports she gets nervous at doctors office  Pulse 87   Temp 98.4 F (36.9 C) (Oral)   Resp 16   Wt 204 lb 6.4 oz (92.7 kg)   BMI 39.92 kg/m  Vitals:   09/22/17 0915  BP: 140/90  Pulse: 87  Resp: 16  Temp: 98.4 F (36.9 C)  TempSrc: Oral  Weight: 204 lb 6.4  oz (92.7 kg)     Physical Exam  Constitutional: She appears well-developed and well-nourished. No distress.  HENT:  Head: Normocephalic and atraumatic.  Right Ear: Hearing, tympanic membrane, external ear and ear canal normal.  Left Ear: Hearing, tympanic membrane, external ear and ear canal normal.  Nose: Nose normal.  Mouth/Throat: Uvula is midline, oropharynx is clear and moist and mucous membranes are normal. No oropharyngeal exudate.  Cerumen impaction noted bilaterally. Ear lavage performed and successful.   Eyes: Pupils are equal, round, and reactive to light. Conjunctivae are normal. Right eye exhibits no discharge. Left eye exhibits no discharge. No scleral icterus.  Neck: Normal range of motion. Neck supple. No tracheal deviation present. No thyromegaly present.  Cardiovascular: Normal rate, regular rhythm and normal heart sounds. Exam reveals no gallop and no friction rub.  No murmur heard. Pulmonary/Chest: Effort normal and breath sounds normal. No stridor. No respiratory distress. She has no wheezes. She has no rales.  Lymphadenopathy:       Head (right side): Posterior auricular and occipital adenopathy present.       Head (left side): Posterior auricular and occipital adenopathy present.    She has cervical adenopathy.       Right cervical: Posterior cervical adenopathy present.       Left cervical: Posterior cervical adenopathy present.  Skin: Skin is warm and dry. She is not  diaphoretic.  Vitals reviewed.      Assessment & Plan:     1. Posterior cervical lymphadenopathy Will treat with doxycycline as below for possible infection. If no improvements in lymphadenopathy she is to call the office for recheck. Will then check labs and get imaging if no improvements.  - doxycycline (VIBRA-TABS) 100 MG tablet; Take 1 tablet (100 mg total) by mouth 2 (two) times daily.  Dispense: 20 tablet; Refill: 0  2. Bilateral impacted cerumen Successful lavage bilaterally today.  -  Ear Lavage       Mar Daring, PA-C  [facility]

## 2017-10-02 ENCOUNTER — Encounter: Payer: Self-pay | Admitting: Physician Assistant

## 2018-02-04 ENCOUNTER — Encounter

## 2018-02-04 ENCOUNTER — Ambulatory Visit (INDEPENDENT_AMBULATORY_CARE_PROVIDER_SITE_OTHER): Payer: BLUE CROSS/BLUE SHIELD

## 2018-02-04 ENCOUNTER — Ambulatory Visit: Payer: BLUE CROSS/BLUE SHIELD | Admitting: Podiatry

## 2018-02-04 ENCOUNTER — Encounter: Payer: Self-pay | Admitting: Podiatry

## 2018-02-04 VITALS — Temp 98.0°F

## 2018-02-04 DIAGNOSIS — M779 Enthesopathy, unspecified: Secondary | ICD-10-CM | POA: Diagnosis not present

## 2018-02-04 DIAGNOSIS — M7661 Achilles tendinitis, right leg: Secondary | ICD-10-CM | POA: Diagnosis not present

## 2018-02-04 DIAGNOSIS — M778 Other enthesopathies, not elsewhere classified: Secondary | ICD-10-CM

## 2018-02-04 DIAGNOSIS — S9032XA Contusion of left foot, initial encounter: Secondary | ICD-10-CM

## 2018-02-04 MED ORDER — METHYLPREDNISOLONE 4 MG PO TABS: ORAL_TABLET | ORAL | 0 refills | Status: DC

## 2018-02-04 MED ORDER — MELOXICAM 15 MG PO TABS: 15.0000 mg | ORAL_TABLET | Freq: Every day | ORAL | 3 refills | Status: DC
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
## 2018-02-04 NOTE — Patient Instructions (Signed)

## 2018-03-09 ENCOUNTER — Ambulatory Visit: Payer: Self-pay | Admitting: Podiatry

## 2018-07-09 LAB — BASIC METABOLIC PANEL
BUN: 11 (ref 4–21)
Creatinine: 0.6 (ref 0.5–1.1)
Glucose: 85
Potassium: 4.3 (ref 3.4–5.3)
Sodium: 139 (ref 137–147)

## 2018-07-09 LAB — HEPATIC FUNCTION PANEL
ALT: 128 — AB (ref 7–35)
AST: 69 — AB (ref 13–35)
Alkaline Phosphatase: 96 (ref 25–125)
Bilirubin, Total: 0.6

## 2018-07-17 ENCOUNTER — Ambulatory Visit: Payer: BC Managed Care – PPO | Admitting: Family Medicine

## 2018-07-17 ENCOUNTER — Other Ambulatory Visit: Payer: Self-pay

## 2018-07-17 ENCOUNTER — Encounter: Payer: Self-pay | Admitting: Family Medicine

## 2018-07-17 VITALS — BP 178/102 | HR 83 | Temp 98.5°F | Resp 16 | Wt 212.0 lb

## 2018-07-17 DIAGNOSIS — H6123 Impacted cerumen, bilateral: Secondary | ICD-10-CM

## 2018-07-17 DIAGNOSIS — R7401 Elevation of levels of liver transaminase levels: Secondary | ICD-10-CM

## 2018-07-17 DIAGNOSIS — R030 Elevated blood-pressure reading, without diagnosis of hypertension: Secondary | ICD-10-CM

## 2018-07-17 DIAGNOSIS — R740 Nonspecific elevation of levels of transaminase and lactic acid dehydrogenase [LDH]: Secondary | ICD-10-CM | POA: Diagnosis not present

## 2018-07-17 NOTE — Patient Instructions (Addendum)
. Try OTC Debrox ear drop to clear out ear canals   Stop meloxicam for a few weeks to see if your blood pressure improves.     DASH Eating Plan DASH stands for "Dietary Approaches to Stop Hypertension." The DASH eating plan is a healthy eating plan that has been shown to reduce high blood pressure (hypertension). It may also reduce your risk for type 2 diabetes, heart disease, and stroke. The DASH eating plan may also help with weight loss. What are tips for following this plan?  General guidelines  Avoid eating more than 2,300 mg (milligrams) of salt (sodium) a day. If you have hypertension, you may need to reduce your sodium intake to 1,500 mg a day.  Limit alcohol intake to no more than 1 drink a day for nonpregnant women and 2 drinks a day for men. One drink equals 12 oz of beer, 5 oz of wine, or 1 oz of hard liquor.  Work with your health care provider to maintain a healthy body weight or to lose weight. Ask what an ideal weight is for you.  Get at least 30 minutes of exercise that causes your heart to beat faster (aerobic exercise) most days of the week. Activities may include walking, swimming, or biking.  Work with your health care provider or diet and nutrition specialist (dietitian) to adjust your eating plan to your individual calorie needs. Reading food labels   Check food labels for the amount of sodium per serving. Choose foods with less than 5 percent of the Daily Value of sodium. Generally, foods with less than 300 mg of sodium per serving fit into this eating plan.  To find whole grains, look for the word "whole" as the first word in the ingredient list. Shopping  Buy products labeled as "low-sodium" or "no salt added."  Buy fresh foods. Avoid canned foods and premade or frozen meals. Cooking  Avoid adding salt when cooking. Use salt-free seasonings or herbs instead of table salt or sea salt. Check with your health care provider or pharmacist before using salt  substitutes.  Do not fry foods. Cook foods using healthy methods such as baking, boiling, grilling, and broiling instead.  Cook with heart-healthy oils, such as olive, canola, soybean, or sunflower oil. Meal planning  Eat a balanced diet that includes: ? 5 or more servings of fruits and vegetables each day. At each meal, try to fill half of your plate with fruits and vegetables. ? Up to 6-8 servings of whole grains each day. ? Less than 6 oz of lean meat, poultry, or fish each day. A 3-oz serving of meat is about the same size as a deck of cards. One egg equals 1 oz. ? 2 servings of low-fat dairy each day. ? A serving of nuts, seeds, or beans 5 times each week. ? Heart-healthy fats. Healthy fats called Omega-3 fatty acids are found in foods such as flaxseeds and coldwater fish, like sardines, salmon, and mackerel.  Limit how much you eat of the following: ? Canned or prepackaged foods. ? Food that is high in trans fat, such as fried foods. ? Food that is high in saturated fat, such as fatty meat. ? Sweets, desserts, sugary drinks, and other foods with added sugar. ? Full-fat dairy products.  Do not salt foods before eating.  Try to eat at least 2 vegetarian meals each week.  Eat more home-cooked food and less restaurant, buffet, and fast food.  When eating at a restaurant, ask that  your food be prepared with less salt or no salt, if possible. What foods are recommended? The items listed may not be a complete list. Talk with your dietitian about what dietary choices are best for you. Grains Whole-grain or whole-wheat bread. Whole-grain or whole-wheat pasta. Brown rice. [place]. Bulgur. Whole-grain and low-sodium cereals. Pita bread. Low-fat, low-sodium crackers. Whole-wheat flour tortillas. Vegetables Fresh or frozen vegetables (raw, steamed, roasted, or grilled). Low-sodium or reduced-sodium tomato and vegetable juice. Low-sodium or reduced-sodium tomato sauce and tomato  paste. Low-sodium or reduced-sodium canned vegetables. Fruits All fresh, dried, or frozen fruit. Canned fruit in natural juice (without added sugar). Meat and other protein foods Skinless chicken or [place]. Ground chicken or [place]. Pork with fat trimmed off. Fish and seafood. Egg whites. Dried beans, peas, or lentils. Unsalted nuts, nut butters, and seeds. Unsalted canned beans. Lean cuts of beef with fat trimmed off. Low-sodium, lean deli meat. Dairy Low-fat (1%) or fat-free (skim) milk. Fat-free, low-fat, or reduced-fat cheeses. Nonfat, low-sodium ricotta or cottage cheese. Low-fat or nonfat yogurt. Low-fat, low-sodium cheese. Fats and oils Soft margarine without trans fats. Vegetable oil. Low-fat, reduced-fat, or light mayonnaise and salad dressings (reduced-sodium). Canola, safflower, olive, soybean, and sunflower oils. Avocado. Seasoning and other foods Herbs. Spices. Seasoning mixes without salt. Unsalted popcorn and pretzels. Fat-free sweets. What foods are not recommended? The items listed may not be a complete list. Talk with your dietitian about what dietary choices are best for you. Grains Baked goods made with fat, such as croissants, muffins, or some breads. Dry pasta or rice meal packs. Vegetables Creamed or fried vegetables. Vegetables in a cheese sauce. Regular canned vegetables (not low-sodium or reduced-sodium). Regular canned tomato sauce and paste (not low-sodium or reduced-sodium). Regular tomato and vegetable juice (not low-sodium or reduced-sodium). Angie Fava. Olives. Fruits Canned fruit in a light or heavy syrup. Fried fruit. Fruit in cream or butter sauce. Meat and other protein foods Fatty cuts of meat. Ribs. Fried meat. Berniece Salines. Sausage. Bologna and other processed lunch meats. Salami. Fatback. Hotdogs. Bratwurst. Salted nuts and seeds. Canned beans with added salt. Canned or smoked fish. Whole eggs or egg yolks. Chicken or [place] with skin. Dairy Whole or 2% milk,  cream, and half-and-half. Whole or full-fat cream cheese. Whole-fat or sweetened yogurt. Full-fat cheese. Nondairy creamers. Whipped toppings. Processed cheese and cheese spreads. Fats and oils Butter. Stick margarine. Lard. Shortening. Ghee. Bacon fat. Tropical oils, such as coconut, palm kernel, or palm oil. Seasoning and other foods Salted popcorn and pretzels. Onion salt, garlic salt, seasoned salt, table salt, and sea salt. Worcestershire sauce. Tartar sauce. Barbecue sauce. Teriyaki sauce. Soy sauce, including reduced-sodium. Steak sauce. Canned and packaged gravies. Fish sauce. Oyster sauce. Cocktail sauce. Horseradish that you find on the shelf. Ketchup. Mustard. Meat flavorings and tenderizers. Bouillon cubes. Hot sauce and Tabasco sauce. Premade or packaged marinades. Premade or packaged taco seasonings. Relishes. Regular salad dressings. Where to find more information:  National Heart, Lung, and [place]: https://wilson-eaton.com/  American Heart Association: www.heart.org Summary  The DASH eating plan is a healthy eating plan that has been shown to reduce high blood pressure (hypertension). It may also reduce your risk for type 2 diabetes, heart disease, and stroke.  With the DASH eating plan, you should limit salt (sodium) intake to 2,300 mg a day. If you have hypertension, you may need to reduce your sodium intake to 1,500 mg a day.  When on the DASH eating plan, aim to eat more fresh fruits and  vegetables, whole grains, lean proteins, low-fat dairy, and heart-healthy fats.  Work with your health care provider or diet and nutrition specialist (dietitian) to adjust your eating plan to your individual calorie needs. This information is not intended to replace advice given to you by your health care provider. Make sure you discuss any questions you have with your health care provider. Document Released: 01/31/2011 Document Revised: 02/05/2016 Document Reviewed: 02/05/2016 Elsevier  Interactive Patient Education  2019 [employer].

## 2018-07-17 NOTE — Progress Notes (Addendum)
Patient: Kimberly Hancock Female    DOB: 17-Apr-1965   53 y.o.   MRN: [id number] Visit Date: 07/17/2018  Today's Provider: Lelon Huh, MD   Chief Complaint  Patient presents with  . Abnormal Lab  . Blood Pressure Check   Subjective:     HPI Abnormal Labs: Patient is here to discuss abnormal lab results that were recently ordered through her GYN. Had routine labs done including met c showing ast=69 and alt=128, otherwise normal. Denies drinking alcohol, no OTC medications. No abdominal pain, no nausea or vomiting.  Elevated blood pressure:  Patient comes in today stating her blood pressure has been elevated for the past few weeks. Home reading have averaged 150-175/ 99-117. Allergies  Allergen Reactions  . Amoxicillin Itching  . Sulfa Antibiotics Itching and Swelling     Current Outpatient Medications:  .  cyanocobalamin (,VITAMIN B-12,) 1000 MCG/ML injection, Inject into the muscle., Disp: , Rfl:  .  fluconazole (DIFLUCAN) 200 MG tablet, 1 tablet weekly, Disp: , Rfl:  .  meloxicam (MOBIC) 15 MG tablet, Take 1 tablet (15 mg total) by mouth daily., Disp: 30 tablet, Rfl: 3 .  Vitamin D, Ergocalciferol, (DRISDOL) 50000 units CAPS capsule, TAKE ONE CAPSULE BY MOUTH ONE TIME PER WEEK, Disp: , Rfl:   Review of Systems  Constitutional: Negative for appetite change, chills, fatigue and fever.  Respiratory: Negative for chest tightness and shortness of breath.   Cardiovascular: Negative for chest pain and palpitations.  Gastrointestinal: Negative for abdominal pain, nausea and vomiting.  Neurological: Negative for dizziness and weakness.    Social History   Tobacco Use  . Smoking status: Never Smoker  . Smokeless tobacco: Never Used  Substance Use Topics  . Alcohol use: No    Alcohol/week: 0.0 standard drinks      Objective:   BP (!) 178/102 (BP Location: Left Arm, Cuff Size: Large)   Pulse 83   Temp 98.5 F (36.9 C) (Oral)   Resp 16   Wt 212 lb (96.2 kg)    SpO2 98% Comment: room air  BMI 41.40 kg/m  Vitals:   07/17/18 0807 07/17/18 0815  BP: (!) 168/100 (!) 178/102  Pulse: 83   Resp: 16   Temp: 98.5 F (36.9 C)   TempSrc: Oral   SpO2: 98%   Weight: 212 lb (96.2 kg)      Physical Exam  General Appearance:    Alert, cooperative, no distress, obese  HEENT:   Excessive cerumen noted both ear canals.   Eyes:    PERRL, conjunctiva/corneas clear, EOM's intact       Lungs:     Clear to auscultation bilaterally, respirations unlabored  Heart:    Regular rate and rhythm  Neurologic:   Awake, alert, oriented x 3. No apparent focal neurological           defect.          Assessment & Plan    1. Elevated transaminase level  - Comprehensive metabolic panel - Hepatitis C antibody - Hepatitis B Core Antibody, total - Hepatitis A antibody, IgM  2. Elevated blood pressure reading Discussed starting medication versus diet, exercise and weight loss. She is very reluctant to start medications and noted to have fairly labile BP with most recent reading at gyn normal. For now will work on diet and exercise. Anticipate follow up BP in 2-3 months after reviewing labs.    3. Excessive cerumen without impaction.  Lelon Huh, MD  [facility]

## 2018-07-18 LAB — COMPREHENSIVE METABOLIC PANEL
ALT: 175 IU/L — ABNORMAL HIGH (ref 0–32)
AST: 89 IU/L — ABNORMAL HIGH (ref 0–40)
Albumin/Globulin Ratio: 1.7 (ref 1.2–2.2)
Albumin: 4.5 g/dL (ref 3.8–4.9)
Alkaline Phosphatase: 94 IU/L (ref 39–117)
BUN/Creatinine Ratio: 20 (ref 9–23)
BUN: 13 mg/dL (ref 6–24)
Bilirubin Total: 0.5 mg/dL (ref 0.0–1.2)
CO2: 22 mmol/L (ref 20–29)
Calcium: 9.4 mg/dL (ref 8.7–10.2)
Chloride: 103 mmol/L (ref 96–106)
Creatinine, Ser: 0.65 mg/dL (ref 0.57–1.00)
GFR calc Af Amer: 117 mL/min/{1.73_m2} (ref >59)
GFR calc non Af Amer: 102 mL/min/{1.73_m2} (ref >59)
Globulin, Total: 2.6 g/dL (ref 1.5–4.5)
Glucose: 125 mg/dL — ABNORMAL HIGH (ref 65–99)
Potassium: 4.1 mmol/L (ref 3.5–5.2)
Sodium: 139 mmol/L (ref 134–144)
Total Protein: 7.1 g/dL (ref 6.0–8.5)

## 2018-07-18 LAB — HEPATITIS A ANTIBODY, IGM: Hep A IgM: NEGATIVE

## 2018-07-18 LAB — HEPATITIS B CORE ANTIBODY, TOTAL: Hep B Core Total Ab: NEGATIVE

## 2018-07-18 LAB — HEPATITIS C ANTIBODY: Hep C Virus Ab: <0.1 s/co ratio (ref 0.0–0.9)

## 2018-07-21 ENCOUNTER — Other Ambulatory Visit: Payer: Self-pay | Admitting: Obstetrics & Gynecology

## 2018-07-21 ENCOUNTER — Telehealth: Payer: Self-pay

## 2018-07-21 DIAGNOSIS — R7401 Elevation of levels of liver transaminase levels: Secondary | ICD-10-CM

## 2018-07-21 DIAGNOSIS — Z1231 Encounter for screening mammogram for malignant neoplasm of breast: Secondary | ICD-10-CM

## 2018-07-21 MED ORDER — HYDROCHLOROTHIAZIDE 25 MG PO TABS: 25.0000 mg | ORAL_TABLET | Freq: Every day | ORAL | 3 refills | Status: DC

## 2018-07-21 NOTE — Telephone Encounter (Signed)
-----   Message from Birdie Sons, MD sent at 07/21/2018  9:45 AM EDT ----- Liver functions still elevated. Hepatitis tests are negative. Need to order right upper quadrant ultrasound for elevated transaminases.

## 2018-07-21 NOTE — Telephone Encounter (Signed)
Patient advised of message. Please schedule ultrasound.

## 2018-07-21 NOTE — Telephone Encounter (Signed)
Advised patient as below.  

## 2018-07-21 NOTE — Telephone Encounter (Signed)
Have sent prescription for [facility]ctz to [facility]

## 2018-07-21 NOTE — Telephone Encounter (Signed)
Patient advised and agrees with having ultrasound. Order placed.   Patient also voiced to me that she would like to go ahead and start a blood pressure medication since her blood pressure has been high since December. She wants to know if Dr. Caryn Section would send in a prescription to help lower her blood pressure. Please advise. Pharmacy:  [facility]

## 2018-07-28 ENCOUNTER — Ambulatory Visit: Payer: Self-pay

## 2018-07-30 ENCOUNTER — Other Ambulatory Visit: Payer: Self-pay

## 2018-07-30 ENCOUNTER — Ambulatory Visit
Admission: RE | Admit: 2018-07-30 | Discharge: 2018-07-30 | Disposition: A | Discharge status: Home / Self Care | Payer: BC Managed Care – PPO | Source: Ambulatory Visit | Attending: Family Medicine | Admitting: Family Medicine

## 2018-07-30 DIAGNOSIS — R7401 Elevation of levels of liver transaminase levels: Secondary | ICD-10-CM

## 2018-07-30 DIAGNOSIS — R740 Nonspecific elevation of levels of transaminase and lactic acid dehydrogenase [LDH]: Secondary | ICD-10-CM | POA: Insufficient documentation

## 2018-07-31 ENCOUNTER — Telehealth: Payer: Self-pay

## 2018-07-31 NOTE — Telephone Encounter (Signed)
-----   Message from Birdie Sons, MD sent at 07/31/2018  8:12 AM EDT ----- Ultrasound shows fatty liver which can cause elevated liver enzymes. Fatty liver is caused by carbohydrates in diet. Avoid sweets and white starchy foods. Should also get hepatitis a and hepatitis B vaccines since fatty liver makes you more susceptible to them. Follow up to check liver function in four months. Can schedule nurse visit to start hep a and b vaccines.

## 2018-07-31 NOTE — Telephone Encounter (Signed)
Pt advised.  Apts made for follow up and and Hep a&b vaccines.   Thanks,   -Mickel Baas

## 2018-09-03 ENCOUNTER — Ambulatory Visit
Admission: RE | Admit: 2018-09-03 | Discharge: 2018-09-03 | Disposition: A | Discharge status: Home / Self Care | Payer: BC Managed Care – PPO | Source: Ambulatory Visit | Attending: Obstetrics & Gynecology | Admitting: Obstetrics & Gynecology

## 2018-09-03 DIAGNOSIS — Z1231 Encounter for screening mammogram for malignant neoplasm of breast: Secondary | ICD-10-CM | POA: Diagnosis not present

## 2018-09-29 ENCOUNTER — Ambulatory Visit (INDEPENDENT_AMBULATORY_CARE_PROVIDER_SITE_OTHER): Payer: BC Managed Care – PPO | Admitting: Family Medicine

## 2018-09-29 ENCOUNTER — Other Ambulatory Visit: Payer: Self-pay

## 2018-09-29 DIAGNOSIS — Z23 Encounter for immunization: Secondary | ICD-10-CM | POA: Diagnosis not present

## 2018-09-29 NOTE — Progress Notes (Signed)
Vaccine administration only. No E&M service today.   

## 2018-10-12 NOTE — Patient Instructions (Signed)
.   Please review the attached list of medications and notify my office if there are any errors.   . Please bring all of your medications to every appointment so we can make sure that our medication list is the same as yours.   . We will have flu vaccines available after Labor Day. Please go to your pharmacy or call the office in early September to schedule you flu shot.   

## 2018-10-15 ENCOUNTER — Other Ambulatory Visit: Payer: Self-pay | Admitting: Family Medicine

## 2018-12-02 ENCOUNTER — Encounter: Payer: Self-pay | Admitting: Family Medicine

## 2018-12-04 ENCOUNTER — Ambulatory Visit: Payer: BC Managed Care – PPO | Admitting: Family Medicine

## 2018-12-04 ENCOUNTER — Other Ambulatory Visit: Payer: Self-pay

## 2018-12-04 ENCOUNTER — Encounter: Payer: Self-pay | Admitting: Family Medicine

## 2018-12-04 VITALS — BP 134/84 | HR 73 | Temp 97.1°F | Resp 16 | Wt 205.6 lb

## 2018-12-04 DIAGNOSIS — R7989 Other specified abnormal findings of blood chemistry: Secondary | ICD-10-CM

## 2018-12-04 DIAGNOSIS — M160 Bilateral primary osteoarthritis of hip: Secondary | ICD-10-CM | POA: Diagnosis not present

## 2018-12-04 DIAGNOSIS — R768 Other specified abnormal immunological findings in serum: Secondary | ICD-10-CM | POA: Diagnosis not present

## 2018-12-04 DIAGNOSIS — Z23 Encounter for immunization: Secondary | ICD-10-CM | POA: Diagnosis not present

## 2018-12-04 DIAGNOSIS — I10 Essential (primary) hypertension: Secondary | ICD-10-CM | POA: Diagnosis not present

## 2018-12-04 MED ORDER — NAPROXEN 500 MG PO TABS: 500.0000 mg | ORAL_TABLET | Freq: Every day | ORAL | 5 refills | Status: DC

## 2018-12-04 MED ORDER — GABAPENTIN 300 MG PO CAPS: 300.0000 mg | ORAL_CAPSULE | Freq: Every day | ORAL | 5 refills | Status: DC

## 2018-12-04 NOTE — Patient Instructions (Signed)
.   Please review the attached list of medications and notify my office if there are any errors.   . Please bring all of your medications to every appointment so we can make sure that our medication list is the same as yours.   . It is especially important to get the annual flu vaccine this year. If you haven't had it already, please go to your pharmacy or call the office as soon as possible to schedule you flu shot.  

## 2018-12-04 NOTE — Progress Notes (Signed)
Patient: Kimberly Hancock Female    DOB: 02/18/1966   53 y.o.   MRN: [id number] Visit Date: 12/04/2018  Today's Provider: Lelon Huh, MD   Chief Complaint  Patient presents with  . Follow-up   Subjective:     HPI Patient returns to clinic to address elevated LFT. Ultrasound revealed a fatty liver and patient was encouraged to start hepatitis series. Patient reports that she has stopped drinking carbonated beverages and has increased her water intake, patient states that she was previously on Meloxicam to help with tendinitis but she has been off medication due to elevated LFT. Patient states that she would like to discuss today pain management.  Had been on meloxicam for foot pain whcih has improved, but now having more pain in hip which is now her up at night. Had been bothered only on left, but now having pain in right.    Allergies  Allergen Reactions  . Amoxicillin Itching  . Sulfa Antibiotics Itching and Swelling     Current Outpatient Medications:  .  cyanocobalamin (,VITAMIN B-12,) 1000 MCG/ML injection, Inject into the muscle., Disp: , Rfl:  .  fluconazole (DIFLUCAN) 200 MG tablet, 1 tablet weekly, Disp: , Rfl:  .  hydrochlorothiazide (HYDRODIURIL) 25 MG tablet, TAKE 1 TABLET BY MOUTH EVERY DAY, Disp: 90 tablet, Rfl: 3 .  Vitamin D, Ergocalciferol, (DRISDOL) 50000 units CAPS capsule, TAKE ONE CAPSULE BY MOUTH ONE TIME PER WEEK, Disp: , Rfl:   Review of Systems  Constitutional: Negative.   HENT: Negative.   Cardiovascular: Negative.   Gastrointestinal: Negative.   Musculoskeletal: Positive for arthralgias, back pain and joint swelling.  Neurological: Negative.     Social History   Tobacco Use  . Smoking status: Never Smoker  . Smokeless tobacco: Never Used  Substance Use Topics  . Alcohol use: No    Alcohol/week: 0.0 standard drinks      Objective:   BP 134/84   Pulse 73   Temp (!) 97.1 F (36.2 C) (Oral)   Resp 16   Wt 205 lb 9.6 oz (93.3 kg)    BMI 40.15 kg/m  Vitals:   12/04/18 1354 12/04/18 1357  BP: (!) 145/86 134/84  Pulse: 73   Resp: 16   Temp: (!) 97.1 F (36.2 C)   TempSrc: Oral   Weight: 205 lb 9.6 oz (93.3 kg)   Body mass index is 40.15 kg/m.   Physical Exam   General Appearance:    Obese female in no acute distress  Eyes:    PERRL, conjunctiva/corneas clear, EOM's intact       Lungs:     Clear to auscultation bilaterally, respirations unlabored  Heart:    Normal heart rate. Normal rhythm. No murmurs, rubs, or gallops.   MS:   All extremities are intact.   Neurologic:   Awake, alert, oriented x 3. No apparent focal neurological           defect.           Assessment & Plan    1. Essential hypertension Much better since starting hctz. Counseled to work on losing weigh with prudent diet and exercise.   2. Elevated LFTs  - Comprehensive metabolic panel  3. Primary osteoarthritis of both hips - gabapentin (NEURONTIN) 300 MG capsule; Take 1 capsule (300 mg total) by mouth at bedtime.  Dispense: 30 capsule; Refill: 5 - naproxen (NAPROSYN) 500 MG tablet; Take 1 tablet (500 mg total) by mouth at bedtime.  Dispense: 30 tablet; Refill: 5  4. ANA positive Remote history previously treated by Dr. Jefm Bryant with plaquenil (around 2010) consider elevated transaminases will recheck to rule out autoimmune process.  - ANA Direct w/Reflex if Positive  5. Need for influenza vaccination  - Flu Vaccine QUAD 36+ mos IM  6. Need for hepatitis B vaccination  - Hepatitis B vaccine adult IM     Lelon Huh, MD  [facility]

## 2018-12-05 ENCOUNTER — Encounter: Payer: Self-pay | Admitting: Family Medicine

## 2018-12-05 LAB — COMPREHENSIVE METABOLIC PANEL
ALT: 181 IU/L — ABNORMAL HIGH (ref 0–32)
AST: 114 IU/L — ABNORMAL HIGH (ref 0–40)
Albumin/Globulin Ratio: 1.7 (ref 1.2–2.2)
Albumin: 4.7 g/dL (ref 3.8–4.9)
Alkaline Phosphatase: 86 IU/L (ref 39–117)
BUN/Creatinine Ratio: 20 (ref 9–23)
BUN: 14 mg/dL (ref 6–24)
Bilirubin Total: 0.6 mg/dL (ref 0.0–1.2)
CO2: 27 mmol/L (ref 20–29)
Calcium: 9.6 mg/dL (ref 8.7–10.2)
Chloride: 96 mmol/L (ref 96–106)
Creatinine, Ser: 0.71 mg/dL (ref 0.57–1.00)
GFR calc Af Amer: 112 mL/min/{1.73_m2} (ref >59)
GFR calc non Af Amer: 98 mL/min/{1.73_m2} (ref >59)
Globulin, Total: 2.7 g/dL (ref 1.5–4.5)
Glucose: 95 mg/dL (ref 65–99)
Potassium: 3.5 mmol/L (ref 3.5–5.2)
Sodium: 135 mmol/L (ref 134–144)
Total Protein: 7.4 g/dL (ref 6.0–8.5)

## 2018-12-05 LAB — ANA W/REFLEX IF POSITIVE: Anti Nuclear Antibody (ANA): NEGATIVE

## 2018-12-08 ENCOUNTER — Telehealth: Payer: Self-pay

## 2018-12-08 ENCOUNTER — Other Ambulatory Visit: Payer: Self-pay | Admitting: Family Medicine

## 2018-12-08 DIAGNOSIS — R7989 Other specified abnormal findings of blood chemistry: Secondary | ICD-10-CM

## 2018-12-08 DIAGNOSIS — R160 Hepatomegaly, not elsewhere classified: Secondary | ICD-10-CM

## 2018-12-08 NOTE — Telephone Encounter (Signed)
Patient has been advised. KW 

## 2018-12-08 NOTE — Telephone Encounter (Signed)
-----   Message from Birdie Sons, MD sent at 12/08/2018 11:56 AM EDT ----- Liver functions are still going up, need referral to GI for further evaluation. Have entered referral order.

## 2018-12-27 DIAGNOSIS — U071 COVID-19: Secondary | ICD-10-CM

## 2018-12-27 HISTORY — DX: COVID-19: U07.1

## 2018-12-31 ENCOUNTER — Other Ambulatory Visit: Payer: Self-pay

## 2018-12-31 ENCOUNTER — Ambulatory Visit: Payer: BC Managed Care – PPO | Admitting: Family Medicine

## 2018-12-31 ENCOUNTER — Encounter: Payer: Self-pay | Admitting: Family Medicine

## 2018-12-31 VITALS — BP 138/80 | HR 89 | Temp 97.3°F | Wt 208.0 lb

## 2018-12-31 DIAGNOSIS — N390 Urinary tract infection, site not specified: Secondary | ICD-10-CM | POA: Diagnosis not present

## 2018-12-31 LAB — POCT URINALYSIS DIPSTICK
Bilirubin, UA: NEGATIVE
Blood, UA: NEGATIVE
Glucose, UA: NEGATIVE
Ketones, UA: NEGATIVE
Leukocytes, UA: NEGATIVE
Nitrite, UA: NEGATIVE
Protein, UA: NEGATIVE
Spec Grav, UA: 1.01 (ref 1.010–1.025)
Urobilinogen, UA: 0.2 E.U./dL
pH, UA: 5 (ref 5.0–8.0)

## 2018-12-31 MED ORDER — NITROFURANTOIN MONOHYD MACRO 100 MG PO CAPS: 100.0000 mg | ORAL_CAPSULE | Freq: Two times a day (BID) | ORAL | 0 refills | Status: DC

## 2018-12-31 NOTE — Progress Notes (Signed)
Kimberly Hancock  MRN: [id number] DOB: [birth year]  Subjective:  HPI   The patient is a 53 year old female who presents for evaluation of possible urinary tract infection.  She began having some discomfort with urinating 6 days ago.  She took an AZO and got relief until last night.  She began having symptoms again and so she went back on the medicine.    Patient Active Problem List   Diagnosis Date Noted  . Graves disease 06/15/2015  . Elevated LFTs 06/07/2015  . History of gestational diabetes 06/07/2015  . Obesity 06/07/2015  . Osteoarthrosis 06/07/2015  . Breast pain, left 09/17/2014  . Hepatomegaly 03/09/2012  . History of elevated antinuclear antibody (ANA) 08/14/2010    Past Medical History:  Diagnosis Date  . ANA positive   . Hepatomegaly   . History of chicken pox   . Hypothyroidism   . Infected cyst of skin 1999  . Osteoarthritis   . Vitamin D deficiency     Social History   Socioeconomic History  . Marital status: Married    Spouse name: Not on file  . Number of children: 3  . Years of education: Not on file  . Highest education level: Not on file  Occupational History  . Occupation: Web designer    Comment: works for [employer]  . Financial resource strain: Not on file  . Food insecurity    Worry: Not on file    Inability: Not on file  . Transportation needs    Medical: Not on file    Non-medical: Not on file  Tobacco Use  . Smoking status: Never Smoker  . Smokeless tobacco: Never Used  Substance and Sexual Activity  . Alcohol use: No    Alcohol/week: 0.0 standard drinks  . Drug use: No  . Sexual activity: Not on file  Lifestyle  . Physical activity    Days per week: Not on file    Minutes per session: Not on file  . Stress: Not on file  Relationships  . Social Herbalist on phone: Not on file    Gets together: Not on file    Attends religious service: Not on file    Active member of club or  organization: Not on file    Attends meetings of clubs or organizations: Not on file    Relationship status: Not on file  . Intimate partner violence    Fear of current or ex partner: Not on file    Emotionally abused: Not on file    Physically abused: Not on file    Forced sexual activity: Not on file  Other Topics Concern  . Not on file  Social History Narrative  . Not on file    Outpatient Encounter Medications as of 12/31/2018  Medication Sig  . cyanocobalamin (,VITAMIN B-12,) 1000 MCG/ML injection Inject into the muscle.  . fluconazole (DIFLUCAN) 200 MG tablet 1 tablet weekly  . gabapentin (NEURONTIN) 300 MG capsule Take 1 capsule (300 mg total) by mouth at bedtime.  . hydrochlorothiazide (HYDRODIURIL) 25 MG tablet TAKE 1 TABLET BY MOUTH EVERY DAY  . naproxen (NAPROSYN) 500 MG tablet Take 1 tablet (500 mg total) by mouth at bedtime.  . Vitamin D, Ergocalciferol, (DRISDOL) 50000 units CAPS capsule TAKE ONE CAPSULE BY MOUTH ONE TIME PER WEEK   No facility-administered encounter medications on file as of 12/31/2018.     Allergies  Allergen Reactions  . Amoxicillin Itching  .  Sulfa Antibiotics Itching and Swelling    Review of Systems  Constitutional: Negative for chills, diaphoresis, fever and malaise/fatigue.  HENT: Negative for congestion, ear pain, sinus pain and sore throat.   Respiratory: Negative for cough and shortness of breath.   Cardiovascular: Negative for chest pain.  Gastrointestinal: Negative for abdominal pain and diarrhea.  Genitourinary: Positive for dysuria and frequency. Negative for flank pain, hematuria and urgency.  Musculoskeletal: Negative for myalgias.  Neurological: Negative for headaches.    Objective:  BP 138/80 (BP Location: Right Arm, Patient Position: Sitting, Cuff Size: Normal)   Pulse 89   Temp (!) 97.3 F (36.3 C) (Skin)   Wt 208 lb (94.3 kg)   SpO2 98%   BMI 40.62 kg/m   Physical Exam  Assessment and Plan :  1. Urinary tract  infection without hematuria, site unspecified Onset of urinary frequency and urgency 6 days ago. No fever or hematuria. Urinalysis showed many WBC's with 1-2+ bacteria on microscopic exam. Will treat with Macrobid, increase fluid intake and may use AZO-Standard prn discomfort. Recheck pending C&S report. - POCT Urinalysis Dipstick - Urine Culture - nitrofurantoin, macrocrystal-monohydrate, (MACROBID) 100 MG capsule; Take 1 capsule (100 mg total) by mouth 2 (two) times daily.  Dispense: 14 capsule; Refill: 0

## 2019-01-02 LAB — URINE CULTURE

## 2019-01-14 ENCOUNTER — Telehealth: Payer: Self-pay

## 2019-01-14 NOTE — Telephone Encounter (Signed)
Copied from [place] [id number]. Topic: General - Other >> Jan 14, 2019 10:59 AM Lennox Solders wrote: Reason for CRM:pt  said the nurse told  her to have repeat urine test . Pt has finished abx . I do not see order. Please advise

## 2019-01-15 NOTE — Telephone Encounter (Signed)
Lab result message states for patient to repeat UA if still have symptoms after completing Abx. Patient states symptoms are improved.

## 2019-01-18 ENCOUNTER — Other Ambulatory Visit: Payer: Self-pay

## 2019-01-18 DIAGNOSIS — Z20822 Contact with and (suspected) exposure to covid-19: Secondary | ICD-10-CM

## 2019-01-20 LAB — NOVEL CORONAVIRUS, NAA: SARS-CoV-2, NAA: DETECTED — AB

## 2019-01-22 ENCOUNTER — Telehealth: Payer: Self-pay

## 2019-01-22 NOTE — Telephone Encounter (Signed)
Please print work excuse to excuse until 14 days after the onset of symptoms

## 2019-01-22 NOTE — Telephone Encounter (Signed)
Copied from [place] [id number]. Topic: General - Other >> Jan 22, 2019  8:59 AM Reyne Dumas L wrote: Reason for CRM:   Pt states that she was told to call and report to PCP that she was tested for COVID on 11/23 and results came back positive on 11/25.  Pt states at this time she is doing okay, but wanted to report.  Pt will need a work note - but will call back when that is needed.

## 2019-01-27 ENCOUNTER — Ambulatory Visit: Payer: BC Managed Care – PPO | Admitting: Gastroenterology

## 2019-03-09 ENCOUNTER — Other Ambulatory Visit: Payer: Self-pay

## 2019-03-09 ENCOUNTER — Ambulatory Visit: Payer: BC Managed Care – PPO | Admitting: Gastroenterology

## 2019-03-09 VITALS — BP 133/81 | HR 76 | Temp 98.0°F | Ht 60.0 in | Wt 210.2 lb

## 2019-03-09 DIAGNOSIS — R7989 Other specified abnormal findings of blood chemistry: Secondary | ICD-10-CM

## 2019-03-09 DIAGNOSIS — R945 Abnormal results of liver function studies: Secondary | ICD-10-CM

## 2019-03-09 NOTE — Progress Notes (Signed)
Jonathon Bellows MD, MRCP(U.K) [address]  [place]  [place],  [address]  Main: [phone]  Fax: [phone]   Gastroenterology Consultation  Referring Provider:     Birdie Sons, MD Primary Care Physician:  Birdie Sons, MD Primary Gastroenterologist:  Dr. Jonathon Bellows  Reason for Consultation:    Elevated LFTs and hepatomegaly        HPI:   Kimberly Hancock is a 54 y.o. y/o female referred for consultation & management  by Dr. Caryn Section, Kirstie Peri, MD.    The patient has been referred for hepatomegaly and elevated LFTs.  Looking back at the labs has had elevated transaminases of AST of 69 and ALT 128 for over 8 months..  Hepatitis C virus antibody negative, hepatitis B core total antibody negative.  Right upper quadrant ultrasound in June 2020 demonstrated fatty infiltration of the liver.  Denies use of any OTC meds, no herbal supplements, no muscle pain. Was told previously that she had hypothyroidism which was treated and resolved. No other complaint.   Past Medical History:  Diagnosis Date  . ANA positive   . Hepatomegaly   . History of chicken pox   . Hypothyroidism   . Infected cyst of skin 1999  . Osteoarthritis   . Vitamin D deficiency     Past Surgical History:  Procedure Laterality Date  . CESAREAN SECTION  07/03/2005  . COLONOSCOPY WITH PROPOFOL N/A 06/28/2015   Procedure: COLONOSCOPY WITH PROPOFOL;  Surgeon: Robert Bellow, MD;  Location: [facility] ENDOSCOPY;  Service: Endoscopy;  Laterality: N/A;  . LIPOMA EXCISION  2003   mid back/ Dr Bary Castilla  . pap smear  12/28/1998   Normal  . ultrasound  03/09/2012   Hepayomegaly. Liver= 20.6cm    Prior to Admission medications   Medication Sig Start Date End Date Taking? Authorizing Provider  cyanocobalamin (,VITAMIN B-12,) 1000 MCG/ML injection Inject into the muscle. 05/30/17   [provider]  fluconazole (DIFLUCAN) 200 MG tablet 1 tablet weekly 06/18/18   [provider]  gabapentin  (NEURONTIN) 300 MG capsule Take 1 capsule (300 mg total) by mouth at bedtime. 12/04/18   Birdie Sons, MD  hydrochlorothiazide (HYDRODIURIL) 25 MG tablet TAKE 1 TABLET BY MOUTH EVERY DAY 10/15/18   Birdie Sons, MD  naproxen (NAPROSYN) 500 MG tablet Take 1 tablet (500 mg total) by mouth at bedtime. 12/04/18   Birdie Sons, MD  nitrofurantoin, macrocrystal-monohydrate, (MACROBID) 100 MG capsule Take 1 capsule (100 mg total) by mouth 2 (two) times daily. 12/31/18   Chrismon, Vickki Muff, PA  Vitamin D, Ergocalciferol, (DRISDOL) 50000 units CAPS capsule TAKE ONE CAPSULE BY MOUTH ONE TIME PER WEEK 09/10/17   [provider]    Family History  Problem Relation Age of Onset  . Diabetes Mother        type 2  . Cancer Brother 11       bone passed age 59  . Dementia Maternal Grandmother   . Brain cancer Maternal Grandmother   . Congestive Heart Failure Maternal Grandfather   . Dementia Paternal Grandmother   . Stroke Paternal Grandfather   . Breast cancer Neg Hx      Social History   Tobacco Use  . Smoking status: Never Smoker  . Smokeless tobacco: Never Used  Substance Use Topics  . Alcohol use: No    Alcohol/week: 0.0 standard drinks  . Drug use: No    Allergies as of 03/09/2019 - Review  Complete 12/31/2018  Allergen Reaction Noted  . Amoxicillin Itching 11/29/2013  . Sulfa antibiotics Itching and Swelling 11/29/2013    Review of Systems:    All systems reviewed and negative except where noted in HPI.   Physical Exam:  There were no vitals taken for this visit. No LMP recorded. (Menstrual status: Perimenopausal). Psych:  Alert and cooperative. Normal mood and affect. General:   Alert,  Well-developed, well-nourished, pleasant and cooperative in NAD Head:  Normocephalic and atraumatic. Eyes:  Sclera clear, no icterus.   Conjunctiva pink. Ears:  Normal auditory acuity. Neck:  Supple; no masses or thyromegaly. Lungs:  Respirations even and unlabored.  Clear  throughout to auscultation.   No wheezes, crackles, or rhonchi. No acute distress. Heart:  Regular rate and rhythm; no murmurs, clicks, rubs, or gallops. Abdomen:  Normal bowel sounds.  No bruits.  Soft, non-tender and non-distended without masses, hepatosplenomegaly or hernias noted.  No guarding or rebound tenderness.    Neurologic:  Alert and oriented x3;  grossly normal neurologically. Skin:  Intact without significant lesions or rashes. No jaundice. Lymph Nodes:  No significant cervical adenopathy. Psych:  Alert and cooperative. Normal mood and affect.  Imaging Studies: No results found.  Assessment and Plan:   Kimberly Hancock is a 54 y.o. y/o female has been referred for elevated transaminases for over 8 months and hepatomegaly.  Right upper quadrant ultrasound demonstrated hepatic steatosis.  Plan 1.  Obtain autoimmune, viral hepatitis and genetic diseases screening with lab work.  If all labs are negative then the likely etiology of abnormal LFTs as well as hepatomegaly is likely hepatic steatosis.  2.  Limit alcohol consumption, exercise, weight loss, close monitoring of blood pressure, addressing risk factors for cardiovascular disease are the cornerstones of management of fatty liver disease.  Patient information will be provided.  Follow up in 6-8 weeks telephone visit  Dr Jonathon Bellows MD,MRCP(U.K)

## 2019-03-09 NOTE — Patient Instructions (Signed)
Fatty Liver Disease  Fatty liver disease occurs when too much fat has built up in your liver cells. Fatty liver disease is also called hepatic steatosis or steatohepatitis. The liver removes harmful substances from your bloodstream and produces fluids that your body needs. It also helps your body use and store energy from the food you eat. In many cases, fatty liver disease does not cause symptoms or problems. It is often diagnosed when tests are being done for other reasons. However, over time, fatty liver can cause inflammation that may lead to more serious liver problems, such as scarring of the liver (cirrhosis) and liver failure. Fatty liver is associated with insulin resistance, increased body fat, high blood pressure (hypertension), and high cholesterol. These are features of metabolic syndrome and increase your risk for stroke, diabetes, and heart disease. What are the causes? This condition may be caused by:  Drinking too much alcohol.  Poor nutrition.  Obesity.  Cushing's syndrome.  Diabetes.  High cholesterol.  Certain drugs.  Poisons.  Some viral infections.  Pregnancy. What increases the risk? You are more likely to develop this condition if you:  Abuse alcohol.  Are overweight.  Have diabetes.  Have hepatitis.  Have a high triglyceride level.  Are pregnant. What are the signs or symptoms? Fatty liver disease often does not cause symptoms. If symptoms do develop, they can include:  Fatigue.  Weakness.  Weight loss.  Confusion.  Abdominal pain.  Nausea and vomiting.  Yellowing of your skin and the white parts of your eyes (jaundice).  Itchy skin. How is this diagnosed? This condition may be diagnosed by:  A physical exam and medical history.  Blood tests.  Imaging tests, such as an ultrasound, CT scan, or MRI.  A liver biopsy. A small sample of liver tissue is removed using a needle. The sample is then looked at under a microscope. How  is this treated? Fatty liver disease is often caused by other health conditions. Treatment for fatty liver may involve medicines and lifestyle changes to manage conditions such as:  Alcoholism.  High cholesterol.  Diabetes.  Being overweight or obese. Follow these instructions at home:   Do not drink alcohol. If you have trouble quitting, ask your health care provider how to safely quit with the help of medicine or a supervised program. This is important to keep your condition from getting worse.  Eat a healthy diet as told by your health care provider. Ask your health care provider about working with a diet and nutrition specialist (dietitian) to develop an eating plan.  Exercise regularly. This can help you lose weight and control your cholesterol and diabetes. Talk to your health care provider about an exercise plan and which activities are best for you.  Take over-the-counter and prescription medicines only as told by your health care provider.  Keep all follow-up visits as told by your health care provider. This is important. Contact a health care provider if: You have trouble controlling your:  Blood sugar. This is especially important if you have diabetes.  Cholesterol.  Drinking of alcohol. Get help right away if:  You have abdominal pain.  You have jaundice.  You have nausea and vomiting.  You vomit blood or material that looks like coffee grounds.  You have stools that are black, tar-like, or bloody. Summary  Fatty liver disease develops when too much fat builds up in the cells of your liver.  Fatty liver disease often causes no symptoms or problems. However, over   time, fatty liver can cause inflammation that may lead to more serious liver problems, such as scarring of the liver (cirrhosis).  You are more likely to develop this condition if you abuse alcohol, are pregnant, are overweight, have diabetes, have hepatitis, or have high triglyceride  levels.  Contact your health care provider if you have trouble controlling your weight, blood sugar, cholesterol, or drinking of alcohol. This information is not intended to replace advice given to you by your health care provider. Make sure you discuss any questions you have with your health care provider. Document Revised: 01/24/2017 Document Reviewed: 11/20/2016 Elsevier Patient Education  2020 Elsevier Inc.  

## 2019-03-12 LAB — TSH: TSH: 2.28 u[IU]/mL (ref 0.450–4.500)

## 2019-03-12 LAB — CK: Total CK: 86 U/L (ref 32–182)

## 2019-03-12 LAB — IRON,TIBC AND FERRITIN PANEL
Ferritin: 50 ng/mL (ref 15–150)
Iron Saturation: 13 % — ABNORMAL LOW (ref 15–55)
Iron: 51 ug/dL (ref 27–159)
Total Iron Binding Capacity: 385 ug/dL (ref 250–450)
UIBC: 334 ug/dL (ref 131–425)

## 2019-03-12 LAB — HIV ANTIBODY (ROUTINE TESTING W REFLEX): HIV Screen 4th Generation wRfx: NONREACTIVE

## 2019-03-12 LAB — ANA: Anti Nuclear Antibody (ANA): NEGATIVE

## 2019-03-12 LAB — HEPATITIS B E ANTIBODY: Hep B E Ab: NEGATIVE

## 2019-03-12 LAB — CELIAC DISEASE AB SCREEN W/RFX
Antigliadin Abs, IgA: 6 units (ref 0–19)
Transglutaminase IgA: <2 U/mL (ref 0–3)

## 2019-03-12 LAB — HEPATITIS B E ANTIGEN: Hep B E Ag: NEGATIVE

## 2019-03-12 LAB — IMMUNOGLOBULINS A/E/G/M, SERUM
IgA/Immunoglobulin A, Serum: 229 mg/dL (ref 87–352)
IgE (Immunoglobulin E), Serum: 15 IU/mL (ref 6–495)
IgG (Immunoglobin G), Serum: 1202 mg/dL (ref 586–1602)
IgM (Immunoglobulin M), Srm: 124 mg/dL (ref 26–217)

## 2019-03-12 LAB — CERULOPLASMIN: Ceruloplasmin: 27.3 mg/dL (ref 19.0–39.0)

## 2019-03-12 LAB — MITOCHONDRIAL/SMOOTH MUSCLE AB PNL
Mitochondrial Ab: <20 Units (ref 0.0–20.0)
Smooth Muscle Ab: 7 Units (ref 0–19)

## 2019-03-12 LAB — HEPATITIS A ANTIBODY, TOTAL: hep A Total Ab: POSITIVE — AB

## 2019-03-12 LAB — ALPHA-1-ANTITRYPSIN: A-1 Antitrypsin: 147 mg/dL (ref 101–187)

## 2019-03-12 LAB — HEPATITIS B SURFACE ANTIGEN: Hepatitis B Surface Ag: NEGATIVE

## 2019-03-12 LAB — ANTI-MICROSOMAL ANTIBODY LIVER / KIDNEY: LKM1 Ab: 1 Units (ref 0.0–20.0)

## 2019-03-12 LAB — HEPATITIS B SURFACE ANTIBODY,QUALITATIVE: Hep B Surface Ab, Qual: REACTIVE

## 2019-03-18 ENCOUNTER — Other Ambulatory Visit: Payer: Self-pay | Admitting: Family Medicine

## 2019-03-22 ENCOUNTER — Telehealth: Payer: Self-pay

## 2019-03-22 NOTE — Telephone Encounter (Signed)
Patient verbalized understanding of results  

## 2019-03-22 NOTE — Telephone Encounter (Signed)
-----   Message from Jonathon Bellows, MD sent at 03/21/2019 10:39 AM EST ----- Normal labs

## 2019-03-25 ENCOUNTER — Ambulatory Visit: Payer: BC Managed Care – PPO | Admitting: Gastroenterology

## 2019-04-20 ENCOUNTER — Other Ambulatory Visit: Payer: Self-pay

## 2019-04-20 ENCOUNTER — Ambulatory Visit (INDEPENDENT_AMBULATORY_CARE_PROVIDER_SITE_OTHER): Payer: BC Managed Care – PPO | Admitting: Family Medicine

## 2019-04-20 DIAGNOSIS — Z23 Encounter for immunization: Secondary | ICD-10-CM | POA: Diagnosis not present

## 2019-04-20 NOTE — Progress Notes (Signed)
Nurse visit only. Administered Hepatitis A and Hepatitis B vaccine.

## 2019-04-26 ENCOUNTER — Telehealth: Payer: Self-pay | Admitting: Gastroenterology

## 2019-04-26 NOTE — Telephone Encounter (Signed)
Kimberly Hancock inform All labs are normal.  Most likely fatty liver disease.  You can send her patient information about the same.  And she can follow-up with her primary care physician Dr. Caryn Section.  If there is any other input required she can always be referred back

## 2019-04-26 NOTE — Telephone Encounter (Signed)
Patient called & l/m on v/m stating she did not have the $80 co-pay for the televist 04-28-19 just to hear her results are fine.   Please advise. Does she need to keep appt.

## 2019-04-26 NOTE — Telephone Encounter (Signed)
Spoke with pt and informed her of Dr. Georgeann Oppenheim recommendations. Pt agrees.

## 2019-04-28 ENCOUNTER — Ambulatory Visit: Payer: BC Managed Care – PPO | Admitting: Gastroenterology

## 2019-06-20 ENCOUNTER — Other Ambulatory Visit: Payer: Self-pay | Admitting: Family Medicine

## 2019-06-20 NOTE — Telephone Encounter (Signed)
Requested Prescriptions  Pending Prescriptions Disp Refills  . naproxen (NAPROSYN) 500 MG tablet [Pharmacy Med Name: NAPROXEN 500 MG TABLET] 90 tablet 1    Sig: TAKE 1 TABLET (500 MG TOTAL) BY MOUTH AT BEDTIME.     Analgesics:  NSAIDS Failed - 06/20/2019 11:36 AM      Failed - HGB in normal range and within 360 days    Hemoglobin  Date Value Ref Range Status  05/16/2015 13.7 12.0 - 16.0 g/dL Final         Passed - Cr in normal range and within 360 days    Creatinine, Ser  Date Value Ref Range Status  12/04/2018 0.71 0.57 - 1.00 mg/dL Final         Passed - Patient is not pregnant      Passed - Valid encounter within last 12 months    Recent Outpatient Visits          2 months ago Need for hepatitis B vaccination   [facility] Birdie Sons, MD   5 months ago Urinary tract infection without hematuria, site unspecified   [facility], Vickki Muff, [place]   6 months ago Essential hypertension   [facility] Birdie Sons, MD   8 months ago Need for hepatitis B vaccination   [facility] Birdie Sons, MD   11 months ago Elevated transaminase level   [facility] Birdie Sons, MD

## 2019-07-21 ENCOUNTER — Other Ambulatory Visit: Payer: Self-pay | Admitting: Obstetrics & Gynecology

## 2019-08-09 LAB — HEPATIC FUNCTION PANEL
ALT: 142 — AB (ref 7–35)
AST: 61 — AB (ref 13–35)
Alkaline Phosphatase: 79 (ref 25–125)
Bilirubin, Total: 0.4

## 2019-08-09 LAB — BASIC METABOLIC PANEL
BUN: 15 (ref 4–21)
CO2: 32 — AB (ref 13–22)
Chloride: 104 (ref 99–108)
Creatinine: 0.6 (ref 0.5–1.1)
Glucose: 185
Potassium: 4 (ref 3.4–5.3)
Sodium: 140 (ref 137–147)

## 2019-08-09 LAB — HEMOGLOBIN A1C: Hemoglobin A1C: 7.6

## 2019-08-09 LAB — COMPREHENSIVE METABOLIC PANEL
Albumin: 4.2 (ref 3.5–5.0)
Calcium: 9.3 (ref 8.7–10.7)
GFR calc non Af Amer: 104

## 2019-08-10 ENCOUNTER — Other Ambulatory Visit: Payer: Self-pay | Admitting: Obstetrics & Gynecology

## 2019-08-10 DIAGNOSIS — Z1231 Encounter for screening mammogram for malignant neoplasm of breast: Secondary | ICD-10-CM

## 2019-08-11 ENCOUNTER — Telehealth: Payer: Self-pay

## 2019-08-11 NOTE — Telephone Encounter (Signed)
Please contact pt and have her schedule appt for new onset diabetes.

## 2019-08-11 NOTE — Telephone Encounter (Signed)
Copied from [place] [id number]. Topic: General - Other >> Aug 11, 2019  8:54 AM Rainey Pines A wrote: [facility] to make Dr. Caryn Section aware that paperwork concerning patient being recently diagnosed diabetic is being faxed over today.

## 2019-08-11 NOTE — Telephone Encounter (Signed)
appt made for 08/17/19 at 9:20 a.m. cbe

## 2019-08-16 NOTE — Progress Notes (Signed)
Established patient visit   Patient: Kimberly Hancock   DOB: Jul 02, 1965   54 y.o. Female  MRN: [id number] Visit Date: 08/17/2019  Today's healthcare provider: Lelon Huh, MD   Chief Complaint  Patient presents with  . Diabetes   [employer] as a scribe for Lelon Huh, MD.,have documented all relevant documentation on the behalf of Lelon Huh, MD,as directed by  Lelon Huh, MD while in the presence of Lelon Huh, MD.  Subjective    HPI Diabetes Mellitus Type II,   Lab Results  Component Value Date   HGBA1C 7.6 08/09/2019   HGBA1C 5.7 05/16/2015   Wt Readings from Last 3 Encounters:  08/17/19 215 lb (97.5 kg)  03/09/19 210 lb 3.2 oz (95.3 kg)  12/31/18 208 lb (94.3 kg)    Patient is here to discuss new onset Diabetes. She had recent labs done through [facility] on 08/09/2019, which showed an elevated A1C of 7.6.   Symptoms: No fatigue No foot ulcerations  No appetite changes No nausea  No paresthesia of the feet  No polydipsia  No polyuria No visual disturbances   No vomiting     Home blood sugar records: not being checked  Episodes of hypoglycemia? No    Current insulin regiment: None  Most Recent Eye Exam: N/A Current exercise: walking Current diet habits: in general, a "healthy" diet    Pertinent Labs: Lab Results  Component Value Date   CHOL 188 05/16/2015   HDL 53 05/16/2015   LDLCALC 112 05/16/2015   TRIG 113 05/16/2015   Lab Results  Component Value Date   NA 140 08/09/2019   K 4.0 08/09/2019   CREATININE 0.6 08/09/2019   GFRNONAA 104 08/09/2019   GFRAA 112 12/04/2018   GLUCOSE 95 12/04/2018     ---------------------------------------------------------------------------------------------------     Medications: Outpatient Medications Prior to Visit  Medication Sig  . cyanocobalamin (,VITAMIN B-12,) 1000 MCG/ML injection INJECT 1 ML INTO THE MUSCLE MONTHLY  . fluconazole (DIFLUCAN) 200 MG tablet 1 tablet  weekly  . hydrochlorothiazide (HYDRODIURIL) 25 MG tablet TAKE 1 TABLET BY MOUTH EVERY DAY  . naproxen (NAPROSYN) 500 MG tablet TAKE 1 TABLET (500 MG TOTAL) BY MOUTH AT BEDTIME.  Marland Kitchen Vitamin D, Ergocalciferol, (DRISDOL) 50000 units CAPS capsule TAKE ONE CAPSULE BY MOUTH ONE TIME PER WEEK  . [DISCONTINUED] gabapentin (NEURONTIN) 300 MG capsule Take 1 capsule (300 mg total) by mouth at bedtime.  . [DISCONTINUED] nitrofurantoin, macrocrystal-monohydrate, (MACROBID) 100 MG capsule Take 1 capsule (100 mg total) by mouth 2 (two) times daily.   No facility-administered medications prior to visit.    Review of Systems  Constitutional: Negative for appetite change, chills, fatigue and fever.  Respiratory: Negative for chest tightness and shortness of breath.   Cardiovascular: Negative for chest pain and palpitations.  Gastrointestinal: Negative for abdominal pain, nausea and vomiting.  Neurological: Negative for dizziness and weakness.      Objective    BP 134/84 (BP Location: Right Arm, Patient Position: Sitting, Cuff Size: Large)   Pulse 84   Temp (!) 97.5 F (36.4 C) (Temporal)   Ht 5' (1.524 m)   Wt 215 lb (97.5 kg)   BMI 41.99 kg/m    Physical Exam   General appearance: Severely obese female, cooperative and in no acute distress Head: Normocephalic, without obvious abnormality, atraumatic Respiratory: Respirations even and unlabored, normal respiratory rate Extremities: All extremities are intact.  Skin: Skin color, texture, turgor normal. No rashes seen  Psych:  Appropriate mood and affect. Neurologic: Mental status: Alert, oriented to person, place, and time, thought content appropriate.   No results found for any visits on 08/17/19.  Assessment & Plan     1. Type 2 diabetes mellitus without complication, without long-term current use of insulin (HCC) New onset - Amb ref to Medical Nutrition Therapy-MNT - metFORMIN (GLUCOPHAGE) 500 MG tablet; Take 1 tablet (500 mg total) by  mouth every evening.  Dispense: 90 tablet; Refill: 3   Follow up in 2 months.          Lelon Huh, MD  [facility] [phone] (phone) [phone] (fax)  [place]

## 2019-08-17 ENCOUNTER — Ambulatory Visit: Payer: BC Managed Care – PPO | Admitting: Family Medicine

## 2019-08-17 ENCOUNTER — Encounter: Payer: Self-pay | Admitting: Family Medicine

## 2019-08-17 ENCOUNTER — Other Ambulatory Visit: Payer: Self-pay

## 2019-08-17 VITALS — BP 134/84 | HR 84 | Temp 97.5°F | Ht 60.0 in | Wt 215.0 lb

## 2019-08-17 DIAGNOSIS — E119 Type 2 diabetes mellitus without complications: Secondary | ICD-10-CM | POA: Diagnosis not present

## 2019-08-17 MED ORDER — METFORMIN HCL 500 MG PO TABS: 500.0000 mg | ORAL_TABLET | Freq: Every evening | ORAL | 3 refills | Status: DC

## 2019-08-17 NOTE — Patient Instructions (Signed)
.   Please review the attached list of medications and notify my office if there are any errors.   . Please bring all of your medications to every appointment so we can make sure that our medication list is the same as yours.   

## 2019-08-17 NOTE — Progress Notes (Signed)
Established patient visit   Patient: Kimberly Hancock   DOB: 05-28-1965   53 y.o. Female  MRN: [id number] Visit Date: 10/18/2019  Today's healthcare provider: Lelon Huh, MD   Chief Complaint  Patient presents with  . Diabetes   Subjective    HPI Diabetes Mellitus Type II, Follow-up  Lab Results  Component Value Date   HGBA1C 7.6 08/09/2019   HGBA1C 5.7 05/16/2015   Wt Readings from Last 3 Encounters:  10/18/19 214 lb 12.8 oz (97.4 kg)  09/28/19 211 lb 6.4 oz (95.9 kg)  09/21/19 (!) 212 lb (96.2 kg)   Last seen for new onset diabetes on 08/17/2019.  Management since then includes starting metformin and referring to lifestyle center for diabetic education. She reports excellent compliance with treatment. She did not go to diabetes education since it was $1500 out of her deductible, but she did have a nutritional consult which she felt was helpful She is not having side effects.  Symptoms: No fatigue No foot ulcerations  No appetite changes No nausea  No paresthesia of the feet  No polydipsia  No polyuria No visual disturbances   No vomiting     Home blood sugar records: not being checked  Episodes of hypoglycemia? No    Current insulin regiment: none Most Recent Eye Exam: within the past 37months Current exercise: walking Current diet habits: in general, a "healthy" diet    Pertinent Labs: Lab Results  Component Value Date   CHOL 188 05/16/2015   HDL 53 05/16/2015   LDLCALC 112 05/16/2015   TRIG 113 05/16/2015   Lab Results  Component Value Date   NA 140 08/09/2019   K 4.0 08/09/2019   CREATININE 0.6 08/09/2019   GFRNONAA 104 08/09/2019   GFRAA 112 12/04/2018   GLUCOSE 95 12/04/2018     ---------------------------------------------------------------------------------------------------    Medications: Outpatient Medications Prior to Visit  Medication Sig  . cyanocobalamin (,VITAMIN B-12,) 1000 MCG/ML injection INJECT 1 ML INTO THE MUSCLE  MONTHLY  . hydrochlorothiazide (HYDRODIURIL) 25 MG tablet TAKE 1 TABLET BY MOUTH EVERY DAY  . metFORMIN (GLUCOPHAGE) 500 MG tablet Take 1 tablet (500 mg total) by mouth every evening.  . naproxen (NAPROSYN) 500 MG tablet TAKE 1 TABLET (500 MG TOTAL) BY MOUTH AT BEDTIME.  Marland Kitchen Vitamin D, Ergocalciferol, (DRISDOL) 50000 units CAPS capsule TAKE ONE CAPSULE BY MOUTH ONE TIME PER WEEK   No facility-administered medications prior to visit.        Objective    BP 124/84   Pulse 79   Temp 98.4 F (36.9 C) (Oral)   Resp 15   Wt 214 lb 12.8 oz (97.4 kg)   SpO2 96%   BMI 41.95 kg/m  Wt Readings from Last 3 Encounters:  10/18/19 214 lb 12.8 oz (97.4 kg)  09/28/19 211 lb 6.4 oz (95.9 kg)  09/21/19 (!) 212 lb (96.2 kg)      Physical Exam  General appearance: Severely obese female, cooperative and in no acute distress Head: Normocephalic, without obvious abnormality, atraumatic Respiratory: Respirations even and unlabored, normal respiratory rate Extremities: All extremities are intact.  Skin: Skin color, texture, turgor normal. No rashes seen  Psych: Appropriate mood and affect. Neurologic: Mental status: Alert, oriented to person, place, and time, thought content appropriate.   Results for orders placed or performed in visit on 10/18/19  POCT glycosylated hemoglobin (Hb A1C)  Result Value Ref Range   Hemoglobin A1C 7.0 (A) 4.0 - 5.6 %   HbA1c  POC (<> result, manual entry)     HbA1c, POC (prediabetic range)     HbA1c, POC (controlled diabetic range)      Assessment & Plan     1. Type 2 diabetes mellitus without complication, without long-term current use of insulin (HCC) Improved with initiation of metformin. Is eating less carbs and walking abou 35 minutes 4-5 days a week. Will increase from one daily to - metFORMIN (GLUCOPHAGE) 500 MG tablet; Take 1 tablet (500 mg total) by mouth 2 (two) times daily.  Dispense: 180 tablet; Refill: 2   Follow up 3-4 months.       The entirety  of the information documented in the History of Present Illness, Review of Systems and Physical Exam were personally obtained by me. Portions of this information were initially documented by the CMA and reviewed by me for thoroughness and accuracy.      Lelon Huh, MD  [facility] [phone] (phone) [phone] (fax)  [place]

## 2019-08-17 NOTE — Patient Instructions (Addendum)
Covid-19 vaccines: The Covid vaccines have been given to hundreds of millions of people and found to be very effective and are as safe as any other vaccine.  The [employer] vaccine has been associated with very rare dangerous blood clots, but only in adult women under the age of 20.  The risk of dying from Covid infections is much higher than having a serious reaction to the vaccine.  I strongly recommend getting fully vaccinated against Covid-19.  I recommend that adult women under 60 get fully vaccinated, but the [place] vaccines may be safer for those women than the [employer] vaccine.    Diabetes Basics  Diabetes (diabetes mellitus) is a long-term (chronic) disease. It occurs when the body does not properly use sugar (glucose) that is released from food after you eat. Diabetes may be caused by one or both of these problems:  Your pancreas does not make enough of a hormone called insulin.  Your body does not react in a normal way to insulin that it makes. Insulin lets sugars (glucose) go into cells in your body. This gives you energy. If you have diabetes, sugars cannot get into cells. This causes high blood sugar (hyperglycemia). Follow these instructions at home: How is diabetes treated? You may need to take insulin or other diabetes medicines daily to keep your blood sugar in balance. Take your diabetes medicines every day as told by your doctor. List your diabetes medicines here: What do I need to know about low blood sugar? Low blood sugar is called hypoglycemia. This is when blood sugar is at or below 70 mg/dL (3.9 mmol/L). Symptoms may include:  Feeling: ? Hungry. ? Worried or nervous (anxious). ? Sweaty and clammy. ? Confused. ? Dizzy. ? Sleepy. ? Sick to your stomach (nauseous).  Having: ? A fast heartbeat. ? A headache. ? A change in your vision. ? Tingling or no feeling (numbness) around the mouth, lips, or tongue. ? Jerky movements that you  cannot control (seizure).  Having trouble with: ? Moving (coordination). ? Sleeping. ? Passing out (fainting). ? Getting upset easily (irritability). Treating low blood sugar To treat low blood sugar, eat or drink something sugary right away. If you can think clearly and swallow safely, follow the 15:15 rule:  Take 15 grams of a fast-acting carb (carbohydrate). Talk with your doctor about how much you should take.  Some fast-acting carbs are: ? Sugar tablets (glucose pills). Take 3-4 glucose pills. ? 6-8 pieces of hard candy. ? 4-6 oz (120-150 mL) of fruit juice. ? 4-6 oz (120-150 mL) of regular (not diet) soda. ? 1 Tbsp (15 mL) honey or sugar.  Check your blood sugar 15 minutes after you take the carb.  If your blood sugar is still at or below 70 mg/dL (3.9 mmol/L), take 15 grams of a carb again.  If your blood sugar does not go above 70 mg/dL (3.9 mmol/L) after 3 tries, get help right away.  After your blood sugar goes back to normal, eat a meal or a snack within 1 hour. Treating very low blood sugar If your blood sugar is at or below 54 mg/dL (3 mmol/L), you have very low blood sugar (severe hypoglycemia). This is an emergency. Do not wait to see if the symptoms will go away. Get medical help right away. Call your local emergency services (911 in the [place] Do not drive yourself to the hospital. Questions to ask your health care provider  Do I need  to meet with a diabetes educator?  What equipment will I need to care for myself at home?  What diabetes medicines do I need? When should I take them?  How often do I need to check my blood sugar?  What number can I call if I have questions?  When is my next doctor's visit?  Where can I find a support group for people with diabetes? Where to find more information  American Diabetes Association: www.diabetes.org  American Association of Diabetes Educators: www.diabeteseducator.org/patient-resources Contact a doctor  if:  Your blood sugar is at or above 240 mg/dL (13.3 mmol/L) for 2 days in a row.  You have been sick or have had a fever for 2 days or more, and you are not getting better.  You have any of these problems for more than 6 hours: ? You cannot eat or drink. ? You feel sick to your stomach (nauseous). ? You throw up (vomit). ? You have watery poop (diarrhea). Get help right away if:  Your blood sugar is lower than 54 mg/dL (3 mmol/L).  You get confused.  You have trouble: ? Thinking clearly. ? Breathing. Summary  Diabetes (diabetes mellitus) is a long-term (chronic) disease. It occurs when the body does not properly use sugar (glucose) that is released from food after digestion.  Take insulin and diabetes medicines as told.  Check your blood sugar every day, as often as told.  Keep all follow-up visits as told by your doctor. This is important. This information is not intended to replace advice given to you by your health care provider. Make sure you discuss any questions you have with your health care provider. Document Revised: 11/04/2018 Document Reviewed: 05/16/2017 Elsevier Patient Education  [place].

## 2019-08-25 ENCOUNTER — Encounter: Payer: Self-pay | Admitting: Family Medicine

## 2019-08-27 ENCOUNTER — Telehealth: Payer: Self-pay | Admitting: Family Medicine

## 2019-08-27 NOTE — Telephone Encounter (Signed)
Per Parke Poisson, referral order was sent to [facility] on 08/17/2019. Judson Roch says that there is only one person scheduling appointment for the lifestyle center, so it make take a while before they contact the patient. I called patient and advised her of this. I also gave patient the number to the lifestyle center ([phone]) and the scheduler Tina's name.

## 2019-08-27 NOTE — Telephone Encounter (Signed)
Patient is calling regarding 08/17/19 referral to Nutrition. Has this referral been finalized?  Where does the patient need to go? Please advise with the patient. Patient has not heard anything back. And she would like to get started. Please advise CB- [phone]

## 2019-09-06 ENCOUNTER — Ambulatory Visit
Admission: RE | Admit: 2019-09-06 | Discharge: 2019-09-06 | Disposition: A | Discharge status: Home / Self Care | Payer: BC Managed Care – PPO | Source: Ambulatory Visit | Attending: Obstetrics & Gynecology | Admitting: Obstetrics & Gynecology

## 2019-09-06 DIAGNOSIS — Z1231 Encounter for screening mammogram for malignant neoplasm of breast: Secondary | ICD-10-CM | POA: Insufficient documentation

## 2019-09-07 ENCOUNTER — Encounter: Payer: Self-pay | Admitting: Family Medicine

## 2019-09-21 ENCOUNTER — Encounter: Payer: Self-pay | Admitting: *Deleted

## 2019-09-21 ENCOUNTER — Other Ambulatory Visit: Payer: Self-pay

## 2019-09-21 ENCOUNTER — Encounter: Payer: BC Managed Care – PPO | Attending: Family Medicine | Admitting: *Deleted

## 2019-09-21 VITALS — BP 136/90 | Ht 60.0 in | Wt 212.0 lb

## 2019-09-21 DIAGNOSIS — E119 Type 2 diabetes mellitus without complications: Secondary | ICD-10-CM

## 2019-09-21 DIAGNOSIS — Z713 Dietary counseling and surveillance: Secondary | ICD-10-CM | POA: Diagnosis present

## 2019-09-21 NOTE — Progress Notes (Signed)
Diabetes Self-Management Education  Visit Type: First/Initial  Appt. Start Time: 1315 Appt. End Time: 0938  09/21/2019  [name], identified by name and date of birth, is a 54 y.o. female with a diagnosis of Diabetes: Type 2.   ASSESSMENT  Blood pressure (!) 136/90, height 5' (1.524 m), weight (!) 212 lb (96.2 kg). Body mass index is 41.4 kg/m.   Diabetes Self-Management Education - 09/21/19 1639      Visit Information   Visit Type First/Initial      Initial Visit   Diabetes Type Type 2    Are you currently following a meal plan? No    Are you taking your medications as prescribed? Yes    Date Diagnosed 1 month ago      Health Coping   How would you rate your overall health? Good      Psychosocial Assessment   Patient Belief/Attitude about Diabetes Motivated to manage diabetes   "bothers me"   Self-care barriers None    Self-management support Doctor's office;Family    Patient Concerns Nutrition/Meal planning;Glycemic Control;Medication;Monitoring;Weight Control;Healthy Lifestyle    Special Needs None    Preferred Learning Style Visual;Hands on    Learning Readiness Ready    How often do you need to have someone help you when you read instructions, pamphlets, or other written materials from your doctor or pharmacy? 1 - Never    What is the last grade level you completed in school? BS degree      Pre-Education Assessment   Patient understands the diabetes disease and treatment process. Needs Instruction    Patient understands incorporating nutritional management into lifestyle. Needs Instruction    Patient undertands incorporating physical activity into lifestyle. Needs Instruction    Patient understands using medications safely. Needs Instruction    Patient understands monitoring blood glucose, interpreting and using results Needs Instruction    Patient understands prevention, detection, and treatment of acute complications. Needs Instruction    Patient  understands prevention, detection, and treatment of chronic complications. Needs Instruction    Patient understands how to develop strategies to address psychosocial issues. Needs Instruction    Patient understands how to develop strategies to promote health/change behavior. Needs Instruction      Complications   Last HgB A1C per patient/outside source 7.6 %   08/09/2019   How often do you check your blood sugar? Patient declines   BG in the office was 107 mg/dL at 2:10 pm - 4 hrs pp.   Have you had a dilated eye exam in the past 12 months? Yes    Have you had a dental exam in the past 12 months? Yes    Are you checking your feet? No      Dietary Intake   Breakfast rice krispie treat; granola bar; trail mix    Lunch left-overs; fruit (banana, strawberries, peaches)    Snack (afternoon) 6 peanut M & M's    Dinner beef, chicken, pork, fish, potatoes, peas, beans, corn, rice, pasta, green beans, broccoli, squash, cauliflower, zucchini, lettuce cuccumbers    Beverage(s) water, diet soda      Exercise   Exercise Type Light (walking / raking leaves)    How many days per week to you exercise? 3    How many minutes per day do you exercise? 30    Total minutes per week of exercise 90      Patient Education   Previous Diabetes Education Yes (please comment)   14 years ago for  GDM   Disease state  Definition of diabetes, type 1 and 2, and the diagnosis of diabetes;Factors that contribute to the development of diabetes    Nutrition management  Role of diet in the treatment of diabetes and the relationship between the three main macronutrients and blood glucose level;Food label reading, portion sizes and measuring food.;Reviewed blood glucose goals for pre and post meals and how to evaluate the patients' food intake on their blood glucose level.    Physical activity and exercise  Role of exercise on diabetes management, blood pressure control and cardiac health.    Medications Reviewed patients  medication for diabetes, action, purpose, timing of dose and side effects.    Monitoring Purpose and frequency of SMBG.;Identified appropriate SMBG and/or A1C goals.    Chronic complications Relationship between chronic complications and blood glucose control    Psychosocial adjustment Identified and addressed patients feelings and concerns about diabetes      Individualized Goals (developed by patient)   Reducing Risk Other (comment)   improve blood sugars, decrease medications, prevent diabetes complications, lose weight, lead a healthier lifestyle     Outcomes   Expected Outcomes Demonstrated interest in learning. Expect positive outcomes    Program Status Not Completed           Individualized Plan for Diabetes Self-Management Training:   Learning Objective:  Patient will have a greater understanding of diabetes self-management. Patient education plan is to attend individual and/or group sessions per assessed needs and concerns.   Plan:   Patient Instructions  Exercise: continue walking for 30  minutes 3-4 days a week and gradually increase to 30 minutes 5 x week Eat 3 meals day,   1-2  snacks a day Space meals 4-6 hours apart Limit fried foods and desserts/sweets Complete 3 Day Food Record and bring to next appt Return for appointment with dietitian on Tuesday September 28, 2019 at 11:15 am with [facility] (dietitian)  Expected Outcomes:  Demonstrated interest in learning. Expect positive outcomes  Education material provided:  General Meal Planning Guidelines Simple Meal Plan 3 Day Food Record  If problems or questions, patient to contact team via:  Johny Drilling, RN, [place] [phone]  Future DSME appointment:  The patient is not able to attend  Diabetes Education classes because she hasn't met her deductible and is not able to afford classes. She will return to see the dietitian on September 28, 2019 as her insurance will cover visits for MNT.

## 2019-09-21 NOTE — Patient Instructions (Addendum)
Exercise: continue walking for 30  minutes 3-4 days a week and gradually increase to 30 minutes 5 x week  Eat 3 meals day,   1-2  snacks a day Space meals 4-6 hours apart Limit fried foods and desserts/sweets  Complete 3 Day Food Record and bring to next appt  Return for appointment with dietitian on Tuesday September 28, 2019 at 11:15 am with [facility] (dietitian)

## 2019-09-28 ENCOUNTER — Encounter: Payer: BC Managed Care – PPO | Attending: Family Medicine | Admitting: Dietician

## 2019-09-28 ENCOUNTER — Other Ambulatory Visit: Payer: Self-pay

## 2019-09-28 ENCOUNTER — Encounter: Payer: Self-pay | Admitting: Dietician

## 2019-09-28 VITALS — Ht 60.0 in | Wt 211.4 lb

## 2019-09-28 DIAGNOSIS — Z713 Dietary counseling and surveillance: Secondary | ICD-10-CM | POA: Insufficient documentation

## 2019-09-28 DIAGNOSIS — E119 Type 2 diabetes mellitus without complications: Secondary | ICD-10-CM | POA: Diagnosis not present

## 2019-09-28 NOTE — Progress Notes (Signed)
Medical Nutrition Therapy: Visit start time: 0321  end time: 1225  Assessment:  Diagnosis: Type 2 diabetes Past medical history: hypothyroidism, osteoarthritis, vitamin D deficiency Psychosocial issues/ stress concerns: none   Current weight: 211.4lbs Height: 5'0" Medications, supplements: reconciled list in medical record  Progress and evaluation:   Patient reports reduced portions of some foods, especially sweets and starchy foods.  She has incorporated some regular exercise.  Today's weight is about 1lb less than weight measurement 1 week ago.   Physical activity: walking 30 minutes 3-4 days per week  Dietary Intake:  Usual eating pattern includes 3 meals and 1-2 snacks per day. Dining out frequency: ? meals per week.  Breakfast: granola bar with peanut butter; trail mix Snack: none Lunch: leftovers from supper, sometimes with fruit Snack: 6 peanut M&Ms (was eating 12 or more) Supper: time varies d/t family schedule -- chicken/ pork/ beef/ fish + starch (now smaller portions) + low-carb veggies Snack: occasional If supper is early-- 1/2 pb sandwich; ice cream sandwich at church; trail mix Beverages: water, diet soda ([place])  Nutrition Care Education: Topics covered:  Basic nutrition: basic food groups, appropriate nutrient balance, appropriate meal and snack schedule, general nutrition guidelines    Weight control: importance of low sugar and low fat choices, portion control, options for and benefits of tracking food intake Advanced nutrition: food label reading Diabetes: appropriate meal and snack schedule, appropriate carb intake and balance, healthy carb choices, role of fiber, protein, fat; role of exercise Other: healthy vs unhealthy fats   Nutritional Diagnosis:  [place]-2.2 Altered nutrition-related laboratory As related to type 2 diabetes.  As evidenced by patient with recent HbA1C of 7.6%. [place]-3.3 Overweight/obesity As related to hypthyroidism, history of excess  calories and inadequate physical activity.  As evidenced by patient with current BMI of 41, making diet and lifestyle changes to promote weight loss and BG control.  Intervention:   Instruction and discussion as noted above.  Patient has been making positive diet and lifestyle changes, and is motivated to continue.   No additional significant goals for change at this time; patient will consider monitoring food intake +/or exercise.   Patient declined MNT follow-up at this time, but will schedule later if needed.  Education Materials given:  . Plate Planner with food lists, sample meal pattern . Sample menus . Snacking handout . Diabetes and You booklet ([employer]) . Goals/ instructions   Learner/ who was taught:  . Patient   Level of understanding: Marland Kitchen Verbalizes/ demonstrates competency   Demonstrated degree of understanding via:   Teach back Learning barriers: . None  Willingness to learn/ readiness for change: . Eager, change in progress   Monitoring and Evaluation:  Dietary intake, exercise, BG control, and body weight      follow up: prn

## 2019-09-28 NOTE — Patient Instructions (Signed)
   Great job controlling carb intake and food portions, keep it up!  Continue with regular exercise for good blood sugar control.  Consider keeping a food diary or using an app to track food intake and exercise to help with longer term blood sugar control.

## 2019-10-18 ENCOUNTER — Ambulatory Visit: Payer: BC Managed Care – PPO | Admitting: Family Medicine

## 2019-10-18 ENCOUNTER — Encounter: Payer: Self-pay | Admitting: Family Medicine

## 2019-10-18 ENCOUNTER — Other Ambulatory Visit: Payer: Self-pay

## 2019-10-18 VITALS — BP 124/84 | HR 79 | Temp 98.4°F | Resp 15 | Wt 214.8 lb

## 2019-10-18 DIAGNOSIS — E119 Type 2 diabetes mellitus without complications: Secondary | ICD-10-CM | POA: Diagnosis not present

## 2019-10-18 LAB — POCT GLYCOSYLATED HEMOGLOBIN (HGB A1C): Hemoglobin A1C: 7 % — AB (ref 4.0–5.6)

## 2019-10-18 MED ORDER — METFORMIN HCL 500 MG PO TABS: 500.0000 mg | ORAL_TABLET | Freq: Two times a day (BID) | ORAL | 2 refills | Status: DC

## 2019-10-23 ENCOUNTER — Other Ambulatory Visit: Payer: Self-pay | Admitting: Family Medicine

## 2019-10-23 NOTE — Telephone Encounter (Signed)
Requested Prescriptions  Pending Prescriptions Disp Refills  . hydrochlorothiazide (HYDRODIURIL) 25 MG tablet [Pharmacy Med Name: HYDROCHLOROTHIAZIDE 25 MG TAB] 90 tablet 1    Sig: TAKE 1 TABLET BY MOUTH EVERY DAY     Cardiovascular: Diuretics - Thiazide Passed - 10/23/2019  8:57 AM      Passed - Ca in normal range and within 360 days    Calcium  Date Value Ref Range Status  08/09/2019 9.3 8.7 - 10.7 Final         Passed - Cr in normal range and within 360 days    Creatinine  Date Value Ref Range Status  08/09/2019 0.6 0.5 - 1.1 Final   Creatinine, Ser  Date Value Ref Range Status  12/04/2018 0.71 0.57 - 1.00 mg/dL Final         Passed - K in normal range and within 360 days    Potassium  Date Value Ref Range Status  08/09/2019 4.0 3.4 - 5.3 Final         Passed - Na in normal range and within 360 days    Sodium  Date Value Ref Range Status  08/09/2019 140 137 - 147 Final         Passed - Last BP in normal range    BP Readings from Last 1 Encounters:  10/18/19 124/84         Passed - Valid encounter within last 6 months    Recent Outpatient Visits          5 days ago Type 2 diabetes mellitus without complication, without long-term current use of insulin ([place])   [facility] Birdie Sons, MD   2 months ago Type 2 diabetes mellitus without complication, without long-term current use of insulin [facility])   [facility] Birdie Sons, MD   6 months ago Need for hepatitis B vaccination   [facility] Birdie Sons, MD   9 months ago Urinary tract infection without hematuria, site unspecified   [facility], [place], [place]   10 months ago Essential hypertension   [place], Kirstie Peri, MD      Future Appointments            In 4 months Fisher, Kirstie Peri, MD [facility], [place]

## 2019-10-27 ENCOUNTER — Encounter: Payer: Self-pay | Admitting: Family Medicine

## 2019-10-29 MED ORDER — "BD INSULIN SYRINGE 25G X 1"" 1 ML MISC": 1 refills | Status: DC

## 2020-01-17 ENCOUNTER — Other Ambulatory Visit: Payer: Self-pay | Admitting: Family Medicine

## 2020-01-18 ENCOUNTER — Other Ambulatory Visit: Payer: Self-pay | Admitting: Family Medicine

## 2020-01-18 DIAGNOSIS — E559 Vitamin D deficiency, unspecified: Secondary | ICD-10-CM

## 2020-01-18 NOTE — Telephone Encounter (Signed)
Requested medication (s) are due for refill today: yes  Requested medication (s) are on the active medication list:yes   Last refill:  10/26/2019  Future visit scheduled: yes   Notes to clinic:  50,000 IU strengths are not delegated Medication was filled by a historical provider    Requested Prescriptions  Pending Prescriptions Disp Refills   Vitamin D, Ergocalciferol, (DRISDOL) 1.25 MG (50000 UNIT) CAPS capsule [Pharmacy Med Name: VITAMIN D2 1.25MG (50,000 UNIT)] 12 capsule 1    Sig: TAKE 1 CAPSULE BY MOUTH ONE TIME PER WEEK      Endocrinology:  Vitamins - Vitamin D Supplementation Failed - 01/18/2020 11:07 AM      Failed - 50,000 IU strengths are not delegated      Failed - Phosphate in normal range and within 360 days    No results found for: PHOS        Failed - Vitamin D in normal range and within 360 days    No results found for: [id number], [id number], [id number], [place], [place], [place], [place], 25OHVITD1, 25OHVITD2, 25OHVITD3, VD25OH        Passed - Ca in normal range and within 360 days    Calcium  Date Value Ref Range Status  08/09/2019 9.3 8.7 - 10.7 Final          Passed - Valid encounter within last 12 months    Recent Outpatient Visits           3 months ago Type 2 diabetes mellitus without complication, without long-term current use of insulin ([place])   [facility] Birdie Sons, MD   5 months ago Type 2 diabetes mellitus without complication, without long-term current use of insulin [facility])   [facility] Birdie Sons, MD   9 months ago Need for hepatitis B vaccination   [facility] Birdie Sons, MD   1 year ago Urinary tract infection without hematuria, site unspecified   [place], Vickki Muff, [place]   1 year ago Essential hypertension   [place], Kirstie Peri, MD       Future Appointments             In 1 month Fisher, Kirstie Peri, MD [facility], [place]

## 2020-01-19 NOTE — Telephone Encounter (Signed)
Please advise on refill request. This medication is listed as historical.

## 2020-01-21 DIAGNOSIS — E559 Vitamin D deficiency, unspecified: Secondary | ICD-10-CM | POA: Insufficient documentation

## 2020-02-08 LAB — HM DIABETES EYE EXAM

## 2020-02-21 ENCOUNTER — Ambulatory Visit: Payer: Self-pay | Admitting: Family Medicine

## 2020-02-21 ENCOUNTER — Encounter: Payer: Self-pay | Admitting: Family Medicine

## 2020-02-21 ENCOUNTER — Ambulatory Visit: Payer: BC Managed Care – PPO | Admitting: Family Medicine

## 2020-02-21 ENCOUNTER — Other Ambulatory Visit: Payer: Self-pay

## 2020-02-21 VITALS — BP 136/67 | HR 89 | Temp 98.4°F | Resp 16 | Ht 60.0 in | Wt 214.0 lb

## 2020-02-21 DIAGNOSIS — E559 Vitamin D deficiency, unspecified: Secondary | ICD-10-CM | POA: Diagnosis not present

## 2020-02-21 DIAGNOSIS — E538 Deficiency of other specified B group vitamins: Secondary | ICD-10-CM | POA: Diagnosis not present

## 2020-02-21 DIAGNOSIS — Z23 Encounter for immunization: Secondary | ICD-10-CM

## 2020-02-21 DIAGNOSIS — E119 Type 2 diabetes mellitus without complications: Secondary | ICD-10-CM | POA: Diagnosis not present

## 2020-02-21 LAB — POCT GLYCOSYLATED HEMOGLOBIN (HGB A1C)
Est. average glucose Bld gHb Est-mCnc: 160
Hemoglobin A1C: 7.2 % — AB (ref 4.0–5.6)

## 2020-02-21 MED ORDER — METFORMIN HCL 500 MG PO TABS: 500.0000 mg | ORAL_TABLET | Freq: Three times a day (TID) | ORAL | Status: DC

## 2020-02-21 NOTE — Progress Notes (Signed)
Established patient visit   Patient: Kimberly Hancock   DOB: May 16, 1965   54 y.o. Female  MRN: [id number] Visit Date: 06/20/2020  Today's healthcare provider: Mila Merry, MD   Chief Complaint  Patient presents with  . Diabetes  . Hyperlipidemia   Subjective    HPI  Diabetes Mellitus Type II, Follow-up  Lab Results  Component Value Date   HGBA1C 7.2 (A) 02/21/2020   HGBA1C 7.0 (A) 10/18/2019   HGBA1C 7.6 08/09/2019   Wt Readings from Last 3 Encounters:  06/20/20 211 lb 9.6 oz (96 kg)  02/21/20 214 lb (97.1 kg)  10/18/19 214 lb 12.8 oz (97.4 kg)   Last seen for diabetes on 02-21-2020.  Management since then includes increase metformin to three 500mg  tablets daily. She reports good compliance with treatment. She is not having side effects.  Symptoms: No fatigue No foot ulcerations  No appetite changes No nausea  No paresthesia of the feet  No polydipsia  No polyuria No visual disturbances   No vomiting     Home blood sugar records: blood sugars are not checked  Episodes of hypoglycemia? No    Current insulin regiment: none Most Recent Eye Exam: 02/08/2020 Current exercise: none Current diet habits: well balanced  Pertinent Labs: Lab Results  Component Value Date   CHOL 222 (H) 02/22/2020   HDL 55 02/22/2020   LDLCALC 145 (H) 02/22/2020   TRIG 121 02/22/2020   CHOLHDL 4.0 02/22/2020   Lab Results  Component Value Date   NA 140 02/22/2020   K 4.4 02/22/2020   CREATININE 0.63 02/22/2020   GFRNONAA 101 02/22/2020   GFRAA 117 02/22/2020   GLUCOSE 116 (H) 02/22/2020     ---------------------------------------------------------------------------------------------------  Lipid/Cholesterol, Follow-up   She was last seen for this 02-21-2020 Management since that visit includes prescribing pravastatin 20mg  daily.  She reports good compliance with treatment. She is not having side effects.     ---------------------------------------------------------------------------------------------------      Medications: Outpatient Medications Prior to Visit  Medication Sig  . cyanocobalamin (,VITAMIN B-12,) 1000 MCG/ML injection INJECT 1 ML INTO THE MUSCLE MONTHLY  . hydrochlorothiazide (HYDRODIURIL) 25 MG tablet TAKE 1 TABLET BY MOUTH EVERY DAY  . Insulin Syringe-Needle U-100 (B-D INSULIN SYRINGE 1CC/25GX1") 25G X 1" 1 ML MISC Use monthly for vitamin B12 injections  . metFORMIN (GLUCOPHAGE) 500 MG tablet Take 1 tablet (500 mg total) by mouth 3 (three) times daily.  . naproxen (NAPROSYN) 500 MG tablet TAKE 1 TABLET (500 MG TOTAL) BY MOUTH AT BEDTIME.  . pravastatin (PRAVACHOL) 20 MG tablet Take 1 tablet (20 mg total) by mouth daily.  . Vitamin D, Ergocalciferol, (DRISDOL) 1.25 MG (50000 UNIT) CAPS capsule TAKE 1 CAPSULE BY MOUTH ONE TIME PER WEEK   No facility-administered medications prior to visit.    Review of Systems  Constitutional: Negative for appetite change, chills, fatigue and fever.  Respiratory: Negative for chest tightness and shortness of breath.   Cardiovascular: Negative for chest pain and palpitations.  Gastrointestinal: Negative for abdominal pain, nausea and vomiting.  Neurological: Negative for dizziness and weakness.      Objective    BP 118/78 (BP Location: Left Arm, Patient Position: Sitting, Cuff Size: Large)   Pulse 78   Temp 98.9 F (37.2 C) (Temporal)   Resp 16   Ht 5' (1.524 m)   Wt 211 lb 9.6 oz (96 kg)   BMI 41.33 kg/m    Physical Exam   General appearance: Obese female,  cooperative and in no acute distress Head: Normocephalic, without obvious abnormality, atraumatic Respiratory: Respirations even and unlabored, normal respiratory rate Extremities: All extremities are intact.  Skin: Skin color, texture, turgor normal. No rashes seen  Psych: Appropriate mood and affect. Neurologic: Mental status: Alert, oriented to person, place, and  time, thought content appropriate.   Results for orders placed or performed in visit on 06/20/20  POCT glycosylated hemoglobin (Hb A1C)  Result Value Ref Range   Hemoglobin A1C 6.6 (A) 4.0 - 5.6 %   Est. average glucose Bld gHb Est-mCnc 143     Assessment & Plan     1. Type 2 diabetes mellitus without complication, without long-term current use of insulin (HCC) Doing well with current medications. Doing well with initiation of statin.  - Comprehensive metabolic panel - Lipid panel  Recheck 4- 5 months  2. Need for prophylactic vaccination using tetanus and diphtheria toxoids adsorbed (Td) vaccine  - Tdap vaccine greater than or equal to 7yo IM  3. Need for shingles vaccine  - Varicella-zoster vaccine IM   Future Appointments  Date Time Provider [place]  11/13/2020  1:40 PM Caryn Section, Kirstie Peri, MD [facility]         The entirety of the information documented in the History of Present Illness, Review of Systems and Physical Exam were personally obtained by me. Portions of this information were initially documented by the CMA and reviewed by me for thoroughness and accuracy.      Lelon Huh, MD  [facility] [phone] (phone) [phone] (fax)  [place]

## 2020-02-21 NOTE — Patient Instructions (Addendum)
Increase metformin to three tables daily. You can either take 1 1/2 tablets twice a day, or 1 tablet three times a day

## 2020-02-21 NOTE — Progress Notes (Signed)
Established patient visit   Patient: Kimberly Hancock   DOB: [birth year]   54 y.o. Female  MRN: [id number] Visit Date: 02/21/2020  Today's healthcare provider: Lelon Huh, MD   Chief Complaint  Patient presents with  . Diabetes   Subjective    HPI  Diabetes Mellitus Type II, Follow-up  Lab Results  Component Value Date   HGBA1C 7.2 (A) 02/21/2020   HGBA1C 7.0 (A) 10/18/2019   HGBA1C 7.6 08/09/2019   Wt Readings from Last 3 Encounters:  02/21/20 214 lb (97.1 kg)  10/18/19 214 lb 12.8 oz (97.4 kg)  09/28/19 211 lb 6.4 oz (95.9 kg)   Last seen for diabetes 4 months ago.  Management since then includes increased Metformin 500mg  from 1 tablet daily to 2 tablets daily . She reports good compliance with treatment. She is not having side effects.  Symptoms: No fatigue No foot ulcerations  No appetite changes No nausea  No paresthesia of the feet  No polydipsia  No polyuria No visual disturbances   No vomiting     Home blood sugar records: not being checked  Episodes of hypoglycemia? No    Current insulin regiment: none Most Recent Eye Exam: 01/2020.  Current exercise: no regular exercise Current diet habits: well balanced  Pertinent Labs: Lab Results  Component Value Date   CHOL 188 05/16/2015   HDL 53 05/16/2015   LDLCALC 112 05/16/2015   TRIG 113 05/16/2015   Lab Results  Component Value Date   NA 140 08/09/2019   K 4.0 08/09/2019   CREATININE 0.6 08/09/2019   GFRNONAA 104 08/09/2019   GFRAA 112 12/04/2018   GLUCOSE 95 12/04/2018          Medications: Outpatient Medications Prior to Visit  Medication Sig  . cyanocobalamin (,VITAMIN B-12,) 1000 MCG/ML injection INJECT 1 ML INTO THE MUSCLE MONTHLY  . hydrochlorothiazide (HYDRODIURIL) 25 MG tablet TAKE 1 TABLET BY MOUTH EVERY DAY  . Insulin Syringe-Needle U-100 (B-D INSULIN SYRINGE 1CC/25GX1") 25G X 1" 1 ML MISC Use monthly for vitamin B12 injections  . metFORMIN (GLUCOPHAGE) 500 MG tablet  Take 1 tablet (500 mg total) by mouth 2 (two) times daily.  . naproxen (NAPROSYN) 500 MG tablet TAKE 1 TABLET (500 MG TOTAL) BY MOUTH AT BEDTIME.  Marland Kitchen Vitamin D, Ergocalciferol, (DRISDOL) 1.25 MG (50000 UNIT) CAPS capsule TAKE 1 CAPSULE BY MOUTH ONE TIME PER WEEK   No facility-administered medications prior to visit.    Review of Systems  Constitutional: Negative.   Respiratory: Negative.   Cardiovascular: Negative.   Endocrine: Negative.   Musculoskeletal: Negative.   Neurological: Negative.       Objective    BP 136/67   Pulse 89   Temp 98.4 F (36.9 C)   Resp 16   Ht 5' (1.524 m)   Wt 214 lb (97.1 kg)   BMI 41.79 kg/m    Physical Exam   General appearance: Obese female, cooperative and in no acute distress Head: Normocephalic, without obvious abnormality, atraumatic Respiratory: Respirations even and unlabored, normal respiratory rate Extremities: All extremities are intact.  Skin: Skin color, texture, turgor normal. No rashes seen  Psych: Appropriate mood and affect. Neurologic: Mental status: Alert, oriented to person, place, and time, thought content appropriate.   Results for orders placed or performed in visit on 02/21/20  POCT glycosylated hemoglobin (Hb A1C)  Result Value Ref Range   Hemoglobin A1C 7.2 (A) 4.0 - 5.6 %   Est. average glucose  Bld gHb Est-mCnc 160     Assessment & Plan     1. Type 2 diabetes mellitus without complication, without long-term current use of insulin (HCC) Increase metformin to 1500mg  per day as per patient instructions.  - Lipid panel - Renal function panel - TSH  2. Vitamin D deficiency Check - VITAMIN D 25 Hydroxy (Vit-D Deficiency, Fractures)  3. B12 deficiency Check - Vitamin B12  4. Need for influenza vaccination  - Flu Vaccine QUAD 36+ mos IM (Fluarix/Fluzone)   Return in about 4 months (around 06/21/2020).      The entirety of the information documented in the History of Present Illness, Review of Systems and  Physical Exam were personally obtained by me. Portions of this information were initially documented by the CMA and reviewed by me for thoroughness and accuracy.      06/23/2020, MD  [facility] [phone] (phone) [phone] (fax)  [facility]

## 2020-02-21 NOTE — Patient Instructions (Signed)
.   Please review the attached list of medications and notify my office if there are any errors.   . Please bring all of your medications to every appointment so we can make sure that our medication list is the same as yours.   

## 2020-02-23 ENCOUNTER — Other Ambulatory Visit: Payer: Self-pay | Admitting: *Deleted

## 2020-02-23 LAB — TSH: TSH: 2.65 u[IU]/mL (ref 0.450–4.500)

## 2020-02-23 LAB — RENAL FUNCTION PANEL
Albumin: 4.6 g/dL (ref 3.8–4.9)
BUN/Creatinine Ratio: 24 — ABNORMAL HIGH (ref 9–23)
BUN: 15 mg/dL (ref 6–24)
CO2: 23 mmol/L (ref 20–29)
Calcium: 9.8 mg/dL (ref 8.7–10.2)
Chloride: 100 mmol/L (ref 96–106)
Creatinine, Ser: 0.63 mg/dL (ref 0.57–1.00)
GFR calc Af Amer: 117 mL/min/{1.73_m2} (ref >59)
GFR calc non Af Amer: 101 mL/min/{1.73_m2} (ref >59)
Glucose: 116 mg/dL — ABNORMAL HIGH (ref 65–99)
Phosphorus: 4.1 mg/dL (ref 3.0–4.3)
Potassium: 4.4 mmol/L (ref 3.5–5.2)
Sodium: 140 mmol/L (ref 134–144)

## 2020-02-23 LAB — VITAMIN B12: Vitamin B-12: 360 pg/mL (ref 232–1245)

## 2020-02-23 LAB — LIPID PANEL
Chol/HDL Ratio: 4 ratio (ref 0.0–4.4)
Cholesterol, Total: 222 mg/dL — ABNORMAL HIGH (ref 100–199)
HDL: 55 mg/dL (ref >39)
LDL Chol Calc (NIH): 145 mg/dL — ABNORMAL HIGH (ref 0–99)
Triglycerides: 121 mg/dL (ref 0–149)
VLDL Cholesterol Cal: 22 mg/dL (ref 5–40)

## 2020-02-23 LAB — VITAMIN D 25 HYDROXY (VIT D DEFICIENCY, FRACTURES): Vit D, 25-Hydroxy: 62 ng/mL (ref 30.0–100.0)

## 2020-02-23 MED ORDER — PRAVASTATIN SODIUM 20 MG PO TABS: 20.0000 mg | ORAL_TABLET | Freq: Every day | ORAL | 3 refills | Status: DC

## 2020-03-09 ENCOUNTER — Other Ambulatory Visit: Payer: Self-pay | Admitting: Family Medicine

## 2020-03-09 DIAGNOSIS — E119 Type 2 diabetes mellitus without complications: Secondary | ICD-10-CM

## 2020-03-09 MED ORDER — METFORMIN HCL 500 MG PO TABS: 500.0000 mg | ORAL_TABLET | Freq: Three times a day (TID) | ORAL | 3 refills | Status: DC

## 2020-04-05 ENCOUNTER — Other Ambulatory Visit: Payer: Self-pay | Admitting: Family Medicine

## 2020-04-05 DIAGNOSIS — E538 Deficiency of other specified B group vitamins: Secondary | ICD-10-CM | POA: Insufficient documentation

## 2020-04-05 NOTE — Telephone Encounter (Signed)
Requested medication (s) are due for refill today: yes  Requested medication (s) are on the active medication list:yes  Last refill:  01/24/2020  Future visit scheduled: yes  Notes to clinic:  Medication not assigned to a protocol, review manually   Requested Prescriptions  Pending Prescriptions Disp Refills   cyanocobalamin (,VITAMIN B-12,) 1000 MCG/ML injection [Pharmacy Med Name: CYANOCOBALAMIN 1,000 MCG/ML VL] 3 mL 3    Sig: INJECT 1 ML INTO THE MUSCLE MONTHLY      Off-Protocol Failed - 04/05/2020  8:57 AM      Failed - Medication not assigned to a protocol, review manually.      Passed - Valid encounter within last 12 months    Recent Outpatient Visits           1 month ago Type 2 diabetes mellitus without complication, without long-term current use of insulin ([place])   [facility] Birdie Sons, MD   5 months ago Type 2 diabetes mellitus without complication, without long-term current use of insulin [facility])   [facility] Birdie Sons, MD   7 months ago Type 2 diabetes mellitus without complication, without long-term current use of insulin [facility])   [facility] Birdie Sons, MD   11 months ago Need for hepatitis B vaccination   [facility] Birdie Sons, MD   1 year ago Urinary tract infection without hematuria, site unspecified   [place], Vickki Muff, PA-C       Future Appointments             In 2 months Caryn Section, Kirstie Peri, MD [facility], [facility]            Off-Protocol Failed - 04/05/2020  8:57 AM      Failed - Medication not assigned to a protocol, review manually.      Passed - Valid encounter within last 12 months    Recent Outpatient Visits           1 month ago Type 2 diabetes mellitus without complication, without long-term current use of insulin ([place])   [facility] Birdie Sons, MD   5 months ago Type 2 diabetes mellitus without  complication, without long-term current use of insulin [facility])   [facility] Birdie Sons, MD   7 months ago Type 2 diabetes mellitus without complication, without long-term current use of insulin [facility])   [facility] Birdie Sons, MD   11 months ago Need for hepatitis B vaccination   [facility] Birdie Sons, MD   1 year ago Urinary tract infection without hematuria, site unspecified   [place], Vickki Muff, PA-C       Future Appointments             In 2 months Fisher, Kirstie Peri, MD [facility], [facility]              Signed Prescriptions Disp Refills   naproxen (NAPROSYN) 500 MG tablet 90 tablet 0    Sig: TAKE 1 TABLET (500 MG TOTAL) BY MOUTH AT BEDTIME.      Analgesics:  NSAIDS Failed - 04/05/2020  8:57 AM      Failed - HGB in normal range and within 360 days    Hemoglobin  Date Value Ref Range Status  05/16/2015 13.7 12.0 - 16.0 g/dL Final          Passed - Cr in  normal range and within 360 days    Creatinine, Ser  Date Value Ref Range Status  02/22/2020 0.63 0.57 - 1.00 mg/dL Final          Passed - Patient is not pregnant      Passed - Valid encounter within last 12 months    Recent Outpatient Visits           1 month ago Type 2 diabetes mellitus without complication, without long-term current use of insulin ([place])   [facility] Birdie Sons, MD   5 months ago Type 2 diabetes mellitus without complication, without long-term current use of insulin ([place])   [facility] Birdie Sons, MD   7 months ago Type 2 diabetes mellitus without complication, without long-term current use of insulin ([place])   [facility] Birdie Sons, MD   11 months ago Need for hepatitis B vaccination   [facility] Birdie Sons, MD   1 year ago Urinary tract infection without hematuria, site unspecified   [place], Vickki Muff, PA-C       Future Appointments             In 2 months Fisher, Kirstie Peri, MD [facility], [facility]               hydrochlorothiazide (HYDRODIURIL) 25 MG tablet 90 tablet 0    Sig: TAKE 1 TABLET BY MOUTH EVERY DAY      Cardiovascular: Diuretics - Thiazide Passed - 04/05/2020  8:57 AM      Passed - Ca in normal range and within 360 days    Calcium  Date Value Ref Range Status  02/22/2020 9.8 8.7 - 10.2 mg/dL Final          Passed - Cr in normal range and within 360 days    Creatinine, Ser  Date Value Ref Range Status  02/22/2020 0.63 0.57 - 1.00 mg/dL Final          Passed - K in normal range and within 360 days    Potassium  Date Value Ref Range Status  02/22/2020 4.4 3.5 - 5.2 mmol/L Final          Passed - Na in normal range and within 360 days    Sodium  Date Value Ref Range Status  02/22/2020 140 134 - 144 mmol/L Final          Passed - Last BP in normal range    BP Readings from Last 1 Encounters:  02/21/20 136/67          Passed - Valid encounter within last 6 months    Recent Outpatient Visits           1 month ago Type 2 diabetes mellitus without complication, without long-term current use of insulin ([place])   [facility] Birdie Sons, MD   5 months ago Type 2 diabetes mellitus without complication, without long-term current use of insulin ([place])   [facility] Birdie Sons, MD   7 months ago Type 2 diabetes mellitus without complication, without long-term current use of insulin [facility])   [facility] Birdie Sons, MD   11 months ago Need for hepatitis B vaccination   [facility] Birdie Sons, MD   1 year ago Urinary tract infection without hematuria, site unspecified   [place], Vickki Muff, [place]       Future  Appointments             In 2 months Fisher, Kirstie Peri, MD [facility], [place]

## 2020-06-20 ENCOUNTER — Other Ambulatory Visit: Payer: Self-pay

## 2020-06-20 ENCOUNTER — Ambulatory Visit (INDEPENDENT_AMBULATORY_CARE_PROVIDER_SITE_OTHER): Payer: BC Managed Care – PPO | Admitting: Family Medicine

## 2020-06-20 ENCOUNTER — Encounter: Payer: Self-pay | Admitting: Family Medicine

## 2020-06-20 VITALS — BP 118/78 | HR 78 | Temp 98.9°F | Resp 16 | Ht 60.0 in | Wt 211.6 lb

## 2020-06-20 DIAGNOSIS — Z23 Encounter for immunization: Secondary | ICD-10-CM | POA: Diagnosis not present

## 2020-06-20 DIAGNOSIS — E119 Type 2 diabetes mellitus without complications: Secondary | ICD-10-CM | POA: Diagnosis not present

## 2020-06-20 LAB — POCT GLYCOSYLATED HEMOGLOBIN (HGB A1C)
Est. average glucose Bld gHb Est-mCnc: 143
Hemoglobin A1C: 6.6 % — AB (ref 4.0–5.6)

## 2020-06-21 ENCOUNTER — Encounter: Payer: Self-pay | Admitting: Family Medicine

## 2020-06-21 LAB — COMPREHENSIVE METABOLIC PANEL
ALT: 229 IU/L — ABNORMAL HIGH (ref 0–32)
AST: 155 IU/L — ABNORMAL HIGH (ref 0–40)
Albumin/Globulin Ratio: 2 (ref 1.2–2.2)
Albumin: 4.9 g/dL (ref 3.8–4.9)
Alkaline Phosphatase: 90 IU/L (ref 44–121)
BUN/Creatinine Ratio: 17 (ref 9–23)
BUN: 11 mg/dL (ref 6–24)
Bilirubin Total: 0.4 mg/dL (ref 0.0–1.2)
CO2: 25 mmol/L (ref 20–29)
Calcium: 9.6 mg/dL (ref 8.7–10.2)
Chloride: 102 mmol/L (ref 96–106)
Creatinine, Ser: 0.65 mg/dL (ref 0.57–1.00)
Globulin, Total: 2.5 g/dL (ref 1.5–4.5)
Glucose: 87 mg/dL (ref 65–99)
Potassium: 4 mmol/L (ref 3.5–5.2)
Sodium: 141 mmol/L (ref 134–144)
Total Protein: 7.4 g/dL (ref 6.0–8.5)
eGFR: 104 mL/min/{1.73_m2} (ref >59)

## 2020-06-21 LAB — LIPID PANEL
Chol/HDL Ratio: 3 ratio (ref 0.0–4.4)
Cholesterol, Total: 169 mg/dL (ref 100–199)
HDL: 56 mg/dL (ref >39)
LDL Chol Calc (NIH): 95 mg/dL (ref 0–99)
Triglycerides: 99 mg/dL (ref 0–149)
VLDL Cholesterol Cal: 18 mg/dL (ref 5–40)

## 2020-06-25 ENCOUNTER — Other Ambulatory Visit: Payer: Self-pay | Admitting: Family Medicine

## 2020-07-25 ENCOUNTER — Telehealth: Payer: Self-pay | Admitting: Family Medicine

## 2020-07-25 DIAGNOSIS — R7401 Elevation of levels of liver transaminase levels: Secondary | ICD-10-CM

## 2020-07-25 NOTE — Telephone Encounter (Signed)
Please advise it is time to check liver functions since stopping pravastatin last month. Have left order at front desk. Does not need to be fasting.

## 2020-07-25 NOTE — Telephone Encounter (Signed)
Pt advised.   Thanks,   -Lana Flaim  

## 2020-07-26 ENCOUNTER — Other Ambulatory Visit: Payer: Self-pay

## 2020-07-26 DIAGNOSIS — R7401 Elevation of levels of liver transaminase levels: Secondary | ICD-10-CM

## 2020-07-26 DIAGNOSIS — E119 Type 2 diabetes mellitus without complications: Secondary | ICD-10-CM

## 2020-07-26 LAB — HEPATIC FUNCTION PANEL
ALT: 224 IU/L — ABNORMAL HIGH (ref 0–32)
AST: 138 IU/L — ABNORMAL HIGH (ref 0–40)
Albumin: 4.8 g/dL (ref 3.8–4.9)
Alkaline Phosphatase: 92 IU/L (ref 44–121)
Bilirubin Total: 0.3 mg/dL (ref 0.0–1.2)
Bilirubin, Direct: 0.13 mg/dL (ref 0.00–0.40)
Total Protein: 7.1 g/dL (ref 6.0–8.5)

## 2020-07-26 MED ORDER — PRAVASTATIN SODIUM 20 MG PO TABS: 20.0000 mg | ORAL_TABLET | Freq: Every day | ORAL | 3 refills | Status: DC

## 2020-07-26 NOTE — Telephone Encounter (Signed)
Pt advised.  Please review to make sure I ordered the right U/S  Thanks,   -Mickel Baas

## 2020-07-26 NOTE — Telephone Encounter (Signed)
-----   Message from Birdie Sons, MD sent at 07/26/2020  6:34 AM EDT ----- Liver functions are unchanged.  Is not being affected by cholesterol medications. Need to go ahead and start back on pravastatin. Can send in prescription for 90 days with 3 refills Need to order RUQ ultrasound to follow up on fatty liver disease.

## 2020-08-02 ENCOUNTER — Telehealth: Payer: Self-pay | Admitting: Family Medicine

## 2020-08-02 NOTE — Telephone Encounter (Signed)
Liver ultrasound shows persistent fatty liver diease. This may improve over time if her sugar stays under control. Will need to repeat ultrasound yearly. Continue current medications including pravastatin.  Follow up in August as scheduled.

## 2020-08-02 NOTE — Telephone Encounter (Signed)
Patient advised and verbalized understanding 

## 2020-08-17 ENCOUNTER — Other Ambulatory Visit: Payer: Self-pay

## 2020-08-17 DIAGNOSIS — Z1231 Encounter for screening mammogram for malignant neoplasm of breast: Secondary | ICD-10-CM

## 2020-09-05 ENCOUNTER — Other Ambulatory Visit: Payer: Self-pay | Admitting: Family Medicine

## 2020-09-07 ENCOUNTER — Other Ambulatory Visit: Payer: Self-pay

## 2020-09-07 ENCOUNTER — Ambulatory Visit
Admission: RE | Admit: 2020-09-07 | Discharge: 2020-09-07 | Disposition: A | Discharge status: Home / Self Care | Payer: BC Managed Care – PPO | Source: Ambulatory Visit

## 2020-09-07 DIAGNOSIS — Z1231 Encounter for screening mammogram for malignant neoplasm of breast: Secondary | ICD-10-CM | POA: Diagnosis not present

## 2020-11-13 ENCOUNTER — Encounter: Payer: Self-pay | Admitting: Family Medicine

## 2020-11-13 ENCOUNTER — Other Ambulatory Visit: Payer: Self-pay

## 2020-11-13 ENCOUNTER — Ambulatory Visit: Payer: BC Managed Care – PPO | Admitting: Family Medicine

## 2020-11-13 VITALS — BP 126/87 | HR 74 | Temp 98.4°F | Resp 18 | Wt 205.0 lb

## 2020-11-13 DIAGNOSIS — E119 Type 2 diabetes mellitus without complications: Secondary | ICD-10-CM | POA: Diagnosis not present

## 2020-11-13 DIAGNOSIS — E559 Vitamin D deficiency, unspecified: Secondary | ICD-10-CM | POA: Diagnosis not present

## 2020-11-13 DIAGNOSIS — Z23 Encounter for immunization: Secondary | ICD-10-CM

## 2020-11-13 DIAGNOSIS — R7989 Other specified abnormal findings of blood chemistry: Secondary | ICD-10-CM

## 2020-11-13 LAB — POCT UA - MICROALBUMIN: Microalbumin Ur, POC: NEGATIVE mg/L

## 2020-11-13 LAB — POCT GLYCOSYLATED HEMOGLOBIN (HGB A1C)
Est. average glucose Bld gHb Est-mCnc: 134
Hemoglobin A1C: 6.3 % — AB (ref 4.0–5.6)

## 2020-11-13 NOTE — Progress Notes (Signed)
Established patient visit   Patient: Kimberly Hancock   DOB: 1965-06-05   55 y.o. Female  MRN: [id number] Visit Date: 11/13/2020  Today's healthcare provider: Lelon Huh, MD   Chief Complaint  Patient presents with   Diabetes   Subjective    HPI  Diabetes Mellitus Type II, Follow-up  Lab Results  Component Value Date   HGBA1C 6.3 (A) 11/13/2020   HGBA1C 6.6 (A) 06/20/2020   HGBA1C 7.2 (A) 02/21/2020   Wt Readings from Last 3 Encounters:  11/13/20 205 lb (93 kg)  06/20/20 211 lb 9.6 oz (96 kg)  02/21/20 214 lb (97.1 kg)   Last seen for diabetes 4 months ago.  Management since then includes continuing same medications. She reports good compliance with treatment. She is not having side effects.  Symptoms: No fatigue No foot ulcerations  No appetite changes No nausea  No paresthesia of the feet  No polydipsia  No polyuria No visual disturbances   No vomiting     Home blood sugar records:  blood sugars are not checked  Episodes of hypoglycemia? No    Current insulin regiment: none Most Recent Eye Exam: 02/08/2020 Current exercise: none Current diet habits: well balanced  Pertinent Labs: Lab Results  Component Value Date   CHOL 169 06/20/2020   HDL 56 06/20/2020   LDLCALC 95 06/20/2020   TRIG 99 06/20/2020   CHOLHDL 3.0 06/20/2020   Lab Results  Component Value Date   NA 141 06/20/2020   K 4.0 06/20/2020   CREATININE 0.65 06/20/2020   GFRNONAA 101 02/22/2020   GFRAA 117 02/22/2020   GLUCOSE 87 06/20/2020     ---------------------------------------------------------------------------------------------------     Medications: Outpatient Medications Prior to Visit  Medication Sig   cyanocobalamin (,VITAMIN B-12,) 1000 MCG/ML injection INJECT 1 ML INTO THE MUSCLE MONTHLY   hydrochlorothiazide (HYDRODIURIL) 25 MG tablet TAKE 1 TABLET BY MOUTH EVERY DAY   Insulin Syringe-Needle U-100 (B-D INSULIN SYRINGE 1CC/25GX1") 25G X 1" 1 ML MISC Use monthly  for vitamin B12 injections   metFORMIN (GLUCOPHAGE) 500 MG tablet Take 1 tablet (500 mg total) by mouth 3 (three) times daily.   naproxen (NAPROSYN) 500 MG tablet TAKE 1 TABLET BY MOUTH EVERYDAY AT BEDTIME   pravastatin (PRAVACHOL) 20 MG tablet Take 1 tablet (20 mg total) by mouth daily.   Vitamin D, Ergocalciferol, (DRISDOL) 1.25 MG (50000 UNIT) CAPS capsule TAKE 1 CAPSULE BY MOUTH ONE TIME PER WEEK   No facility-administered medications prior to visit.    Review of Systems  Constitutional:  Negative for appetite change, chills, fatigue and fever.  Respiratory:  Negative for chest tightness and shortness of breath.   Cardiovascular:  Negative for chest pain and palpitations.  Gastrointestinal:  Negative for abdominal pain, nausea and vomiting.  Neurological:  Negative for dizziness and weakness.      Objective    BP 126/87 (BP Location: Left Arm, Patient Position: Sitting, Cuff Size: Large)   Pulse 74   Temp 98.4 F (36.9 C) (Temporal)   Resp 18   Wt 205 lb (93 kg)   BMI 40.04 kg/m  {Show previous vital signs (optional):23777}  Physical Exam    General: Appearance:    Severely obese female in no acute distress  Eyes:    PERRL, conjunctiva/corneas clear, EOM's intact       Lungs:     Clear to auscultation bilaterally, respirations unlabored  Heart:    Normal heart rate. Normal rhythm. No murmurs,  rubs, or gallops.    MS:   All extremities are intact.    Neurologic:   Awake, alert, oriented x 3. No apparent focal neurological defect.        Results for orders placed or performed in visit on 11/13/20  POCT HgB A1C  Result Value Ref Range   Hemoglobin A1C 6.3 (A) 4.0 - 5.6 %   Est. average glucose Bld gHb Est-mCnc 134   POCT UA - Microalbumin  Result Value Ref Range   Microalbumin Ur, POC negative mg/L    Assessment & Plan     1. Type 2 diabetes mellitus without complication, without long-term current use of insulin (HCC) Well controlled.   2. Need for shingles  vaccine  - Administer Zoster, Recombinant (Shingrix) Vaccine #2  3. Need for influenza vaccination  - Flu Vaccine QUAD 36+ mos IM (Fluarix/Fluzone)  4. Vitamin D deficiency  - VITAMIN D 25 Hydroxy (Vit-D Deficiency, Fractures)  5. Elevated LFTs Secondary to fatty liver.  - Hepatic function panel - CBC         The entirety of the information documented in the History of Present Illness, Review of Systems and Physical Exam were personally obtained by me. Portions of this information were initially documented by the CMA and reviewed by me for thoroughness and accuracy.     Lelon Huh, MD  [facility] [phone] (phone) [phone] (fax)  [place]

## 2020-11-14 LAB — CBC
Hematocrit: 36 % (ref 34.0–46.6)
Hemoglobin: 10.8 g/dL — ABNORMAL LOW (ref 11.1–15.9)
MCH: 22.5 pg — ABNORMAL LOW (ref 26.6–33.0)
MCHC: 30 g/dL — ABNORMAL LOW (ref 31.5–35.7)
MCV: 75 fL — ABNORMAL LOW (ref 79–97)
Platelets: 321 10*3/uL (ref 150–450)
RBC: 4.79 x10E6/uL (ref 3.77–5.28)
RDW: 15 % (ref 11.7–15.4)
WBC: 8.6 10*3/uL (ref 3.4–10.8)

## 2020-11-14 LAB — HEPATIC FUNCTION PANEL
ALT: 133 IU/L — ABNORMAL HIGH (ref 0–32)
AST: 82 IU/L — ABNORMAL HIGH (ref 0–40)
Albumin: 4.9 g/dL (ref 3.8–4.9)
Alkaline Phosphatase: 88 IU/L (ref 44–121)
Bilirubin Total: 0.3 mg/dL (ref 0.0–1.2)
Bilirubin, Direct: 0.12 mg/dL (ref 0.00–0.40)
Total Protein: 7.3 g/dL (ref 6.0–8.5)

## 2020-11-14 LAB — VITAMIN D 25 HYDROXY (VIT D DEFICIENCY, FRACTURES): Vit D, 25-Hydroxy: 81.6 ng/mL (ref 30.0–100.0)

## 2020-11-17 ENCOUNTER — Other Ambulatory Visit (INDEPENDENT_AMBULATORY_CARE_PROVIDER_SITE_OTHER): Payer: BC Managed Care – PPO

## 2020-11-17 DIAGNOSIS — D509 Iron deficiency anemia, unspecified: Secondary | ICD-10-CM | POA: Diagnosis not present

## 2020-11-17 LAB — IFOBT (OCCULT BLOOD): IFOBT: POSITIVE

## 2020-11-20 ENCOUNTER — Other Ambulatory Visit: Payer: Self-pay | Admitting: Family Medicine

## 2020-11-20 DIAGNOSIS — R195 Other fecal abnormalities: Secondary | ICD-10-CM

## 2020-11-20 DIAGNOSIS — Z86010 Personal history of colonic polyps: Secondary | ICD-10-CM | POA: Insufficient documentation

## 2020-11-20 DIAGNOSIS — D509 Iron deficiency anemia, unspecified: Secondary | ICD-10-CM | POA: Insufficient documentation

## 2020-11-21 ENCOUNTER — Encounter: Payer: Self-pay | Admitting: *Deleted

## 2020-11-23 LAB — IRON AND TIBC

## 2020-11-23 LAB — SPECIMEN STATUS REPORT

## 2020-11-23 LAB — FERRITIN

## 2020-12-27 ENCOUNTER — Other Ambulatory Visit: Payer: Self-pay | Admitting: Family Medicine

## 2020-12-27 NOTE — Telephone Encounter (Signed)
Requested Prescriptions  Pending Prescriptions Disp Refills  . hydrochlorothiazide (HYDRODIURIL) 25 MG tablet [Pharmacy Med Name: HYDROCHLOROTHIAZIDE 25 MG TAB] 90 tablet 0    Sig: TAKE 1 TABLET BY MOUTH EVERY DAY     Cardiovascular: Diuretics - Thiazide Passed - 12/27/2020  1:29 AM      Passed - Ca in normal range and within 360 days    Calcium  Date Value Ref Range Status  06/20/2020 9.6 8.7 - 10.2 mg/dL Final         Passed - Cr in normal range and within 360 days    Creatinine, Ser  Date Value Ref Range Status  06/20/2020 0.65 0.57 - 1.00 mg/dL Final         Passed - K in normal range and within 360 days    Potassium  Date Value Ref Range Status  06/20/2020 4.0 3.5 - 5.2 mmol/L Final         Passed - Na in normal range and within 360 days    Sodium  Date Value Ref Range Status  06/20/2020 141 134 - 144 mmol/L Final         Passed - Last BP in normal range    BP Readings from Last 1 Encounters:  11/13/20 126/87         Passed - Valid encounter within last 6 months    Recent Outpatient Visits          1 month ago Type 2 diabetes mellitus without complication, without long-term current use of insulin ([place])   [facility] Birdie Sons, MD   6 months ago Type 2 diabetes mellitus without complication, without long-term current use of insulin ([place])   [facility] Birdie Sons, MD   10 months ago Type 2 diabetes mellitus without complication, without long-term current use of insulin ([place])   [facility] Birdie Sons, MD   1 year ago Type 2 diabetes mellitus without complication, without long-term current use of insulin [facility])   [facility] Birdie Sons, MD   1 year ago Type 2 diabetes mellitus without complication, without long-term current use of insulin [facility])   [facility] Birdie Sons, MD      Future Appointments            In 4 months Fisher, Kirstie Peri, MD [facility], [facility]

## 2020-12-29 ENCOUNTER — Other Ambulatory Visit: Payer: Self-pay | Admitting: Family Medicine

## 2020-12-29 NOTE — Telephone Encounter (Signed)
Requested Prescriptions  Pending Prescriptions Disp Refills  . naproxen (NAPROSYN) 500 MG tablet [Pharmacy Med Name: NAPROXEN 500 MG TABLET] 90 tablet 0    Sig: TAKE 1 TABLET BY MOUTH EVERYDAY AT BEDTIME     Analgesics:  NSAIDS Failed - 12/29/2020 10:05 AM      Failed - HGB in normal range and within 360 days    Hemoglobin  Date Value Ref Range Status  11/13/2020 10.8 (L) 11.1 - 15.9 g/dL Final         Passed - Cr in normal range and within 360 days    Creatinine, Ser  Date Value Ref Range Status  06/20/2020 0.65 0.57 - 1.00 mg/dL Final         Passed - Patient is not pregnant      Passed - Valid encounter within last 12 months    Recent Outpatient Visits          1 month ago Type 2 diabetes mellitus without complication, without long-term current use of insulin ([place])   [facility] Birdie Sons, MD   6 months ago Type 2 diabetes mellitus without complication, without long-term current use of insulin ([place])   [facility] Birdie Sons, MD   10 months ago Type 2 diabetes mellitus without complication, without long-term current use of insulin ([place])   [facility] Birdie Sons, MD   1 year ago Type 2 diabetes mellitus without complication, without long-term current use of insulin ([place])   [facility] Birdie Sons, MD   1 year ago Type 2 diabetes mellitus without complication, without long-term current use of insulin [facility])   [place], Kirstie Peri, MD      Future Appointments            In 4 months Fisher, Kirstie Peri, MD [facility] [place]

## 2021-02-01 ENCOUNTER — Ambulatory Visit (INDEPENDENT_AMBULATORY_CARE_PROVIDER_SITE_OTHER): Payer: BC Managed Care – PPO | Admitting: Gastroenterology

## 2021-02-01 ENCOUNTER — Encounter: Payer: Self-pay | Admitting: Gastroenterology

## 2021-02-01 VITALS — BP 137/87 | HR 93 | Temp 98.4°F | Wt 198.0 lb

## 2021-02-01 DIAGNOSIS — R195 Other fecal abnormalities: Secondary | ICD-10-CM

## 2021-02-01 DIAGNOSIS — D509 Iron deficiency anemia, unspecified: Secondary | ICD-10-CM

## 2021-02-01 MED ORDER — CLENPIQ 10-3.5-12 MG-GM -GM/160ML PO SOLN: 1.0000 | Freq: Once | ORAL | 0 refills | Status: AC

## 2021-02-01 NOTE — Progress Notes (Signed)
Kimberly Hancock [address]  [place]  [place], [place] [address]  Main: [phone]  Fax: [phone]   Gastroenterology Consultation  Referring Provider:     Birdie Sons, MD Primary Care Physician:  Birdie Sons, MD Reason for Consultation:     Anemia, FOBT positive        HPI:    Chief Complaint  Patient presents with   New Patient (Initial Visit)    Kimberly Hancock is a 55 y.o. y/o female referred for consultation & management  by Dr. Caryn Section, Kirstie Peri, MD. patient is a primary care provider due to FOBT positive stool.  Testing was done due to microcytic anemia noted on lab work by PCP.  Ferritin and iron panel were ordered but was canceled by lab. The patient denies abdominal or flank pain, anorexia, nausea or vomiting, dysphagia, change in bowel habits or black or bloody stools or weight loss.  Patient was previously seen by Dr. Vicente Males for elevated liver enzymes and hepatomegaly.  Work-up at the time showed fatty liver and recommendations were to follow-up with PCP and her PCP to be referred back to [place] if needed.  Patient reports trying to lose weight since the diagnosis.  Denies any alcohol use  Most recent PCP note reviewed from September 2022  Past Medical History:  Diagnosis Date   ANA positive    COVID-19 12/2018   Diabetes mellitus without complication ([place])    Hepatomegaly    History of chicken pox    Hypothyroidism    Infected cyst of skin 1999   Osteoarthritis    Vitamin D deficiency     Past Surgical History:  Procedure Laterality Date   CESAREAN SECTION  07/03/2005   COLONOSCOPY WITH PROPOFOL N/A 06/28/2015   Procedure: COLONOSCOPY WITH PROPOFOL;  Surgeon: Robert Bellow, MD;  Location: [facility] ENDOSCOPY;  Service: Endoscopy;  Laterality: N/A;   LIPOMA EXCISION  2003   mid back/ Dr Bary Castilla   pap smear  12/28/1998   Normal   ultrasound  03/09/2012   Hepayomegaly. Liver= 20.6cm    Prior to Admission medications   Medication Sig  Start Date End Date Taking? Authorizing Provider  cyanocobalamin (,VITAMIN B-12,) 1000 MCG/ML injection INJECT 1 ML INTO THE MUSCLE MONTHLY 04/05/20  Yes Birdie Sons, MD  hydrochlorothiazide (HYDRODIURIL) 25 MG tablet TAKE 1 TABLET BY MOUTH EVERY DAY 12/27/20  Yes Birdie Sons, MD  Insulin Syringe-Needle U-100 (B-D INSULIN SYRINGE 1CC/25GX1") 25G X 1" 1 ML MISC Use monthly for vitamin B12 injections 10/29/19  Yes Birdie Sons, MD  metFORMIN (GLUCOPHAGE) 500 MG tablet Take 1 tablet (500 mg total) by mouth 3 (three) times daily. 03/09/20  Yes Birdie Sons, MD  naproxen (NAPROSYN) 500 MG tablet TAKE 1 TABLET BY MOUTH EVERYDAY AT BEDTIME 12/29/20  Yes Birdie Sons, MD  pravastatin (PRAVACHOL) 20 MG tablet Take 1 tablet (20 mg total) by mouth daily. 07/26/20  Yes Birdie Sons, MD  Vitamin D, Ergocalciferol, (DRISDOL) 1.25 MG (50000 UNIT) CAPS capsule TAKE 1 CAPSULE BY MOUTH ONE TIME PER WEEK 01/21/20  Yes Birdie Sons, MD  Sod Picosulfate-Mag Ox-Cit Acd (CLENPIQ) 10-3.5-12 MG-GM -GM/160ML SOLN Take 1 kit by mouth once for 1 dose. 02/01/21 02/01/21  Jonathon Bellows, MD    Family History  Problem Relation Age of Onset   Diabetes Mother        type 2   Cancer Brother 47  bone passed age 51   Diabetes Father    Dementia Maternal Grandmother    Brain cancer Maternal Grandmother    Congestive Heart Failure Maternal Grandfather    Dementia Paternal Grandmother    Stroke Paternal Grandfather    Breast cancer Neg Hx      Social History   Tobacco Use   Smoking status: Never   Smokeless tobacco: Never  Substance Use Topics   Alcohol use: No    Alcohol/week: 0.0 standard drinks   Drug use: No    Allergies as of 02/01/2021 - Review Complete 02/01/2021  Allergen Reaction Noted   Amoxicillin Itching 11/29/2013   Sulfa antibiotics Itching and Swelling 11/29/2013    Review of Systems:    All systems reviewed and negative except where noted in HPI.   Physical Exam:   Constitutional: General:   Alert,  Well-developed, well-nourished, pleasant and cooperative in NAD BP 137/87   Pulse 93   Temp 98.4 F (36.9 C) (Oral)   Wt 198 lb (89.8 kg)   BMI 38.67 kg/m   Eyes:  Sclera clear, no icterus.   Conjunctiva pink. PERRLA  Ears:  No scars, lesions or masses, Normal auditory acuity. Nose:  No deformity, discharge, or lesions. Mouth:  No deformity or lesions, oropharynx pink & moist.  Neck:  Supple; no masses or thyromegaly.  Respiratory: Normal respiratory effort, Normal percussion  Gastrointestinal: Soft, non-tender and non-distended without masses, hepatosplenomegaly or hernias noted.  No guarding or rebound tenderness.     Cardiac: No clubbing or edema.  No cyanosis. Normal posterior tibial pedal pulses noted.  Lymphatic:  No significant cervical or axillary adenopathy.  Psych:  Alert and cooperative. Normal mood and affect.  Musculoskeletal:  Normal gait. Head normocephalic, atraumatic. Symmetrical without gross deformities. 5/5 Upper and Lower extremity strength bilaterally.  Skin: Warm. Intact without significant lesions or rashes. No jaundice.  Neurologic:  Face symmetrical, tongue midline, Normal sensation to touch;  grossly normal neurologically.  Psych:  Alert and oriented x3, Alert and cooperative. Normal mood and affect.   Labs: CBC    Component Value Date/Time   WBC 8.6 11/13/2020 1427   RBC 4.79 11/13/2020 1427   HGB 10.8 (L) 11/13/2020 1427   HCT 36.0 11/13/2020 1427   PLT 321 11/13/2020 1427   MCV 75 (L) 11/13/2020 1427   MCH 22.5 (L) 11/13/2020 1427   MCHC 30.0 (L) 11/13/2020 1427   RDW 15.0 11/13/2020 1427   CMP     Component Value Date/Time   NA 141 06/20/2020 1404   K 4.0 06/20/2020 1404   CL 102 06/20/2020 1404   CO2 25 06/20/2020 1404   GLUCOSE 87 06/20/2020 1404   BUN 11 06/20/2020 1404   CREATININE 0.65 06/20/2020 1404   CALCIUM 9.6 06/20/2020 1404   PROT 7.3 11/13/2020 1427   ALBUMIN 4.9 11/13/2020  1427   AST 82 (H) 11/13/2020 1427   ALT 133 (H) 11/13/2020 1427   ALKPHOS 88 11/13/2020 1427   BILITOT 0.3 11/13/2020 1427   GFRNONAA 101 02/22/2020 1226   GFRAA 117 02/22/2020 1226    Imaging Studies: No results found.  Assessment and Plan:   Kimberly Hancock is a 55 y.o. y/o female has been referred for FOBT positive and anemia  Colonoscopy indicated perforated positive stool explained the significance of this finding discussed with the patient and keeping in mind her previous colonoscopy in 2017.  Please see procedure report for details  We will check iron panel and if  labs show iron deficiency, EGD would also be recommended along with the colonoscopy  Weight loss with diet and exercise encouraged again Follow-up in clinic for history of fatty liver as well in 2 to 3 months  I have discussed alternative options, risks & benefits,  which include, but are not limited to, bleeding, infection, perforation,respiratory complication & drug reaction.  The patient agrees with this plan & written consent will be obtained.     Dr Kimberly Hancock  Speech recognition software was used to dictate the above note.

## 2021-02-02 LAB — IRON,TIBC AND FERRITIN PANEL
Ferritin: 18 ng/mL (ref 15–150)
Iron Saturation: 5 % — CL (ref 15–55)
Iron: 24 ug/dL — ABNORMAL LOW (ref 27–159)
Total Iron Binding Capacity: 452 ug/dL — ABNORMAL HIGH (ref 250–450)
UIBC: 428 ug/dL — ABNORMAL HIGH (ref 131–425)

## 2021-03-13 ENCOUNTER — Encounter: Payer: Self-pay | Admitting: Gastroenterology

## 2021-03-13 ENCOUNTER — Ambulatory Visit: Payer: BC Managed Care – PPO | Admitting: Anesthesiology

## 2021-03-13 ENCOUNTER — Other Ambulatory Visit: Payer: Self-pay

## 2021-03-13 ENCOUNTER — Encounter
Admission: RE | Disposition: A | Discharge status: Home / Self Care | Payer: Self-pay | Source: Home / Self Care | Attending: Gastroenterology

## 2021-03-13 ENCOUNTER — Ambulatory Visit
Admission: RE | Admit: 2021-03-13 | Discharge: 2021-03-13 | Disposition: A | Discharge status: Home / Self Care | Payer: BC Managed Care – PPO | Attending: Gastroenterology | Admitting: Gastroenterology

## 2021-03-13 DIAGNOSIS — D122 Benign neoplasm of ascending colon: Secondary | ICD-10-CM | POA: Diagnosis not present

## 2021-03-13 DIAGNOSIS — R195 Other fecal abnormalities: Secondary | ICD-10-CM

## 2021-03-13 DIAGNOSIS — K317 Polyp of stomach and duodenum: Secondary | ICD-10-CM | POA: Insufficient documentation

## 2021-03-13 DIAGNOSIS — D123 Benign neoplasm of transverse colon: Secondary | ICD-10-CM | POA: Diagnosis not present

## 2021-03-13 DIAGNOSIS — Z8616 Personal history of COVID-19: Secondary | ICD-10-CM | POA: Insufficient documentation

## 2021-03-13 DIAGNOSIS — K319 Disease of stomach and duodenum, unspecified: Secondary | ICD-10-CM | POA: Diagnosis not present

## 2021-03-13 DIAGNOSIS — E119 Type 2 diabetes mellitus without complications: Secondary | ICD-10-CM | POA: Diagnosis not present

## 2021-03-13 DIAGNOSIS — D509 Iron deficiency anemia, unspecified: Secondary | ICD-10-CM | POA: Insufficient documentation

## 2021-03-13 DIAGNOSIS — E039 Hypothyroidism, unspecified: Secondary | ICD-10-CM | POA: Diagnosis not present

## 2021-03-13 DIAGNOSIS — M1990 Unspecified osteoarthritis, unspecified site: Secondary | ICD-10-CM | POA: Insufficient documentation

## 2021-03-13 DIAGNOSIS — D126 Benign neoplasm of colon, unspecified: Secondary | ICD-10-CM | POA: Diagnosis not present

## 2021-03-13 DIAGNOSIS — Z7984 Long term (current) use of oral hypoglycemic drugs: Secondary | ICD-10-CM | POA: Diagnosis not present

## 2021-03-13 HISTORY — PX: ESOPHAGOGASTRODUODENOSCOPY: SHX5428

## 2021-03-13 HISTORY — PX: COLONOSCOPY WITH PROPOFOL: SHX5780

## 2021-03-13 SURGERY — COLONOSCOPY WITH PROPOFOL
Anesthesia: General

## 2021-03-13 MED ORDER — PROPOFOL 500 MG/50ML IV EMUL
INTRAVENOUS | Status: AC
  Filled 2021-03-13: qty 50

## 2021-03-13 MED ORDER — SODIUM CHLORIDE 0.9 % IV SOLN: INTRAVENOUS | Status: DC

## 2021-03-13 MED ORDER — PROPOFOL 10 MG/ML IV BOLUS
INTRAVENOUS | Status: DC | PRN
  Administered 2021-03-13: 100 mg via INTRAVENOUS

## 2021-03-13 MED ORDER — LIDOCAINE 2% (20 MG/ML) 5 ML SYRINGE
INTRAMUSCULAR | Status: DC | PRN
  Administered 2021-03-13: 100 mg via INTRAVENOUS

## 2021-03-13 MED ORDER — SODIUM CHLORIDE (PF) 0.9 % IJ SOLN
INTRAMUSCULAR | Status: DC | PRN
  Administered 2021-03-13: 3 mL

## 2021-03-13 MED ORDER — PROPOFOL 500 MG/50ML IV EMUL
INTRAVENOUS | Status: DC | PRN
  Administered 2021-03-13: 200 ug/kg/min via INTRAVENOUS

## 2021-03-13 NOTE — Anesthesia Preprocedure Evaluation (Signed)
Anesthesia Evaluation  Patient identified by MRN, date of birth, ID band Patient awake    Reviewed: Allergy & Precautions, H&P , NPO status , Patient's Chart, lab work & pertinent test results, reviewed documented beta blocker date and time   History of Anesthesia Complications Negative for: history of anesthetic complications  Airway Mallampati: III  TM Distance: >3 FB Neck ROM: full    Dental no notable dental hx. (+) Caps, Teeth Intact   Pulmonary neg pulmonary ROS,    Pulmonary exam normal breath sounds clear to auscultation       Cardiovascular Exercise Tolerance: Good negative cardio ROS Normal cardiovascular exam Rhythm:regular Rate:Normal     Neuro/Psych negative neurological ROS  negative psych ROS   GI/Hepatic negative GI ROS, Hepatomegaly   Endo/Other  diabetesHypothyroidism   Renal/GU negative Renal ROS  negative genitourinary   Musculoskeletal   Abdominal   Peds  Hematology negative hematology ROS (+)   Anesthesia Other Findings Past Medical History:   Infected cyst of skin                           1999         Vitamin D deficiency                                         History of chicken pox                                       Hepatomegaly                                                 ANA positive                                                 Osteoarthritis                                               Hypothyroidism                                               Reproductive/Obstetrics negative OB ROS                             Anesthesia Physical  Anesthesia Plan  ASA: 2  Anesthesia Plan: General   Post-op Pain Management:    Induction: Intravenous  PONV Risk Score and Plan: 3 and Propofol infusion and TIVA  Airway Management Planned: Natural Airway and Nasal Cannula  Additional Equipment:   Intra-op Plan:   Post-operative Plan:   Informed  Consent: I have reviewed the patients History and Physical, chart, labs and discussed the procedure including the risks, benefits and alternatives for the  proposed anesthesia with the patient or authorized representative who has indicated his/her understanding and acceptance.     Dental Advisory Given  Plan Discussed with: Anesthesiologist, CRNA and Surgeon  Anesthesia Plan Comments:         Anesthesia Quick Evaluation

## 2021-03-13 NOTE — Op Note (Signed)
[facility] Gastroenterology Patient Name: Aeron Lheureux Procedure Date: 03/13/2021 8:27 AM MRN: [id number] Account #: [id number] Date of Birth: 30-Sep-1965 Admit Type: Outpatient Age: 56 Room: [facility] ENDO ROOM 2 Gender: Female Note Status: Finalized Instrument Name: Jasper Riling [id number] Procedure:             Colonoscopy Indications:           Iron deficiency anemia Providers:             Jonathon Bellows MD, MD Referring MD:          Kirstie Peri. Caryn Section, MD (Referring MD) Medicines:             Monitored Anesthesia Care Complications:         No immediate complications. Procedure:             Pre-Anesthesia Assessment:                        - Prior to the procedure, a History and Physical was                         performed, and patient medications, allergies and                         sensitivities were reviewed. The patient's tolerance                         of previous anesthesia was reviewed.                        - The risks and benefits of the procedure and the                         sedation options and risks were discussed with the                         patient. All questions were answered and informed                         consent was obtained.                        - ASA Grade Assessment: II - A patient with mild                         systemic disease.                        After obtaining informed consent, the colonoscope was                         passed under direct vision. Throughout the procedure,                         the patient's blood pressure, pulse, and oxygen                         saturations were monitored continuously. The                         Colonoscope was introduced through  the anus and                         advanced to the the cecum, identified by the                         appendiceal orifice. The colonoscopy was performed                         with ease. The patient tolerated the procedure well.                          The quality of the bowel preparation was good. Findings:      The perianal and digital rectal examinations were normal.      A 3 mm polyp was found in the ascending colon. The polyp was sessile.       The polyp was removed with a cold biopsy forceps. Resection and       retrieval were complete.      A 5 mm polyp was found in the transverse colon. The polyp was sessile.       The polyp was removed with a cold snare. Resection and retrieval were       complete.      The exam was otherwise without abnormality on direct and retroflexion       views. Impression:            - One 3 mm polyp in the ascending colon, removed with                         a cold biopsy forceps. Resected and retrieved.                        - One 5 mm polyp in the transverse colon, removed with                         a cold snare. Resected and retrieved.                        - The examination was otherwise normal on direct and                         retroflexion views. Recommendation:        - Discharge patient to home (with escort).                        - Resume previous diet.                        - Continue present medications.                        - Await pathology results.                        - Repeat colonoscopy for surveillance based on                         pathology results.                        -  Return to my office in 6 weeks. Procedure Code(s):     --- Professional ---                        [phone], Colonoscopy, flexible; with removal of                         tumor(s), polyp(s), or other lesion(s) by snare                         technique                        45380, 44, Colonoscopy, flexible; with biopsy, single                         or multiple Diagnosis Code(s):     --- Professional ---                        K63.5, Polyp of colon                        D50.9, Iron deficiency anemia, unspecified CPT copyright 2019 American Medical Association. All rights reserved. The  codes documented in this report are preliminary and upon coder review may  be revised to meet current compliance requirements. Jonathon Bellows, MD Jonathon Bellows MD, MD 03/13/2021 9:06:20 AM This report has been signed electronically. Number of Addenda: 0 Note Initiated On: 03/13/2021 8:27 AM Scope Withdrawal Time: 0 hours 9 minutes 13 seconds  Total Procedure Duration: 0 hours 10 minutes 59 seconds  Estimated Blood Loss:  Estimated blood loss: none.      [facility]

## 2021-03-13 NOTE — Transfer of Care (Signed)
Immediate Anesthesia Transfer of Care Note  Patient: Kimberly Hancock  Procedure(s) Performed: COLONOSCOPY WITH PROPOFOL ESOPHAGOGASTRODUODENOSCOPY (EGD)  Patient Location: PACU  Anesthesia Type:General  Level of Consciousness: sedated  Airway & Oxygen Therapy: Patient Spontanous Breathing  Post-op Assessment: Report given to RN and Post -op Vital signs reviewed and stable  Post vital signs: Reviewed and stable  Last Vitals:  Vitals Value Taken Time  BP 94/59   Temp    Pulse 69 03/13/21 0909  Resp 17 03/13/21 0909  SpO2 97 % 03/13/21 0909  Vitals shown include unvalidated device data.  Last Pain:  Vitals:   03/13/21 0801  TempSrc: Temporal  PainSc: 0-No pain         Complications: No notable events documented.

## 2021-03-13 NOTE — Op Note (Signed)
[facility] Gastroenterology Patient Name: Kimberly Hancock Procedure Date: 03/13/2021 8:29 AM MRN: [id number] Account #: [id number] Date of Birth: 1965/06/23 Admit Type: Outpatient Age: 56 Room: [facility] ENDO ROOM 2 Gender: Female Note Status: Finalized Instrument Name: Altamese Cabal Endoscope [id number] Procedure:             Upper GI endoscopy Indications:           Iron deficiency anemia Providers:             Jonathon Bellows MD, MD Referring MD:          Kirstie Peri. Caryn Section, MD (Referring MD) Medicines:             Monitored Anesthesia Care Complications:         No immediate complications. Procedure:             Pre-Anesthesia Assessment:                        - Prior to the procedure, a History and Physical was                         performed, and patient medications, allergies and                         sensitivities were reviewed. The patient's tolerance                         of previous anesthesia was reviewed.                        - The risks and benefits of the procedure and the                         sedation options and risks were discussed with the                         patient. All questions were answered and informed                         consent was obtained.                        - After reviewing the risks and benefits, the patient                         was deemed in satisfactory condition to undergo the                         procedure.                        - ASA Grade Assessment: II - A patient with mild                         systemic disease.                        After obtaining informed consent, the endoscope was                         passed under direct  vision. Throughout the procedure,                         the patient's blood pressure, pulse, and oxygen                         saturations were monitored continuously. The Endoscope                         was introduced through the mouth, and advanced to the                          third part of duodenum. The upper GI endoscopy was                         accomplished with ease. The patient tolerated the                         procedure well. Findings:      The examined esophagus was normal.      The examined duodenum was normal. Biopsies for histology were taken with       a cold forceps for evaluation of celiac disease.      A single 12 mm semi-sessile polyp with no bleeding and no stigmata of       recent bleeding was found on the greater curvature of the stomach.       Preparations were made for mucosal resection. Saline was injected to       raise the lesion. Snare mucosal resection was performed. Resection and       retrieval were complete. To close a defect after mucosal resection, one       hemostatic clip was successfully placed. There was no bleeding at the       end of the procedure. borders marked usingh blue light or nbi      The cardia and gastric fundus were normal on retroflexion. Impression:            - Normal esophagus.                        - Normal examined duodenum. Biopsied.                        - A single gastric polyp. Resected and retrieved. Clip                         was placed.                        - Mucosal resection was performed. Resection and                         retrieval were complete. Recommendation:        - Discharge patient to home (with escort).                        - Resume previous diet.                        - Continue present medications.                        -  Await pathology results.                        - Repeat upper endoscopy for surveillance based on                         pathology results. Procedure Code(s):     --- Professional ---                        [id number], Esophagogastroduodenoscopy, flexible,                         transoral; with endoscopic mucosal resection                        43239, 20, Esophagogastroduodenoscopy, flexible,                         transoral; with biopsy, single or  multiple Diagnosis Code(s):     --- Professional ---                        K31.7, Polyp of stomach and duodenum                        D50.9, Iron deficiency anemia, unspecified CPT copyright 2019 American Medical Association. All rights reserved. The codes documented in this report are preliminary and upon coder review may  be revised to meet current compliance requirements. Jonathon Bellows, MD Jonathon Bellows MD, MD 03/13/2021 8:52:33 AM This report has been signed electronically. Number of Addenda: 0 Note Initiated On: 03/13/2021 8:29 AM Estimated Blood Loss:  Estimated blood loss: none.      [facility]

## 2021-03-13 NOTE — H&P (Signed)
Jonathon Bellows, MD [address], [place], [place], [place], [address] [address], [place], [place], [place], [address] Phone: [phone]  Fax: [phone]  Primary Care Physician:  Birdie Sons, MD   Pre-Procedure History & Physical: HPI:  Kimberly Hancock is a 56 y.o. female is here for an endoscopy and colonoscopy    Past Medical History:  Diagnosis Date   ANA positive    COVID-19 12/2018   Diabetes mellitus without complication ([place])    Hepatomegaly    History of chicken pox    Hypothyroidism    Infected cyst of skin 1999   Osteoarthritis    Vitamin D deficiency     Past Surgical History:  Procedure Laterality Date   CESAREAN SECTION  07/03/2005   COLONOSCOPY WITH PROPOFOL N/A 06/28/2015   Procedure: COLONOSCOPY WITH PROPOFOL;  Surgeon: Robert Bellow, MD;  Location: [facility] ENDOSCOPY;  Service: Endoscopy;  Laterality: N/A;   LIPOMA EXCISION  2003   mid back/ Dr Bary Castilla   pap smear  12/28/1998   Normal   ultrasound  03/09/2012   Hepayomegaly. Liver= 20.6cm    Prior to Admission medications   Medication Sig Start Date End Date Taking? Authorizing Provider  cyanocobalamin (,VITAMIN B-12,) 1000 MCG/ML injection INJECT 1 ML INTO THE MUSCLE MONTHLY 04/05/20  Yes Birdie Sons, MD  hydrochlorothiazide (HYDRODIURIL) 25 MG tablet TAKE 1 TABLET BY MOUTH EVERY DAY 12/27/20  Yes Birdie Sons, MD  metFORMIN (GLUCOPHAGE) 500 MG tablet Take 1 tablet (500 mg total) by mouth 3 (three) times daily. 03/09/20  Yes Birdie Sons, MD  naproxen (NAPROSYN) 500 MG tablet TAKE 1 TABLET BY MOUTH EVERYDAY AT BEDTIME 12/29/20  Yes Birdie Sons, MD  pravastatin (PRAVACHOL) 20 MG tablet Take 1 tablet (20 mg total) by mouth daily. 07/26/20  Yes Birdie Sons, MD  Vitamin D, Ergocalciferol, (DRISDOL) 1.25 MG (50000 UNIT) CAPS capsule TAKE 1 CAPSULE BY MOUTH ONE TIME PER WEEK 01/21/20  Yes Birdie Sons, MD  Insulin Syringe-Needle U-100 (B-D INSULIN SYRINGE 1CC/25GX1") 25G X  1" 1 ML MISC Use monthly for vitamin B12 injections 10/29/19   Birdie Sons, MD    Allergies as of 02/02/2021 - Review Complete 02/01/2021  Allergen Reaction Noted   Amoxicillin Itching 11/29/2013   Sulfa antibiotics Itching and Swelling 11/29/2013    Family History  Problem Relation Age of Onset   Diabetes Mother        type 2   Cancer Brother 62       bone passed age 16   Diabetes Father    Dementia Maternal Grandmother    Brain cancer Maternal Grandmother    Congestive Heart Failure Maternal Grandfather    Dementia Paternal Grandmother    Stroke Paternal Grandfather    Breast cancer Neg Hx     Social History   Socioeconomic History   Marital status: Married    Spouse name: Not on file   Number of children: 3   Years of education: Not on file   Highest education level: Not on file  Occupational History   Occupation: Web designer    Comment: works for [employer]  Tobacco Use   Smoking status: Never   Smokeless tobacco: Never  Substance and Sexual Activity   Alcohol use: No    Alcohol/week: 0.0 standard drinks   Drug use: No   Sexual activity: Not on file  Other Topics Concern   Not on file  Social History Narrative   Not on file   Social Determinants of Health   Financial Resource Strain: Not on file  Food Insecurity: Not on file  Transportation Needs: Not on file  Physical Activity: Not on file  Stress: Not on file  Social Connections: Not on file  Intimate Partner Violence: Not on file    Review of Systems: See HPI, otherwise negative ROS  Physical Exam: BP (!) 142/79    Pulse 78    Temp 97.7 F (36.5 C) (Temporal)    Resp 20    Ht 5' (1.524 m)    Wt 90.7 kg    SpO2 100%    BMI 39.06 kg/m  General:   Alert,  pleasant and cooperative in NAD Head:  Normocephalic and atraumatic. Neck:  Supple; no masses or thyromegaly. Lungs:  Clear throughout to auscultation, normal respiratory effort.    Heart:  +S1, +S2, Regular rate and rhythm,  No edema. Abdomen:  Soft, nontender and nondistended. Normal bowel sounds, without guarding, and without rebound.   Neurologic:  Alert and  oriented x4;  grossly normal neurologically.  Impression/Plan: Kimberly Hancock is here for an endoscopy and colonoscopy  to be performed for  evaluation of iron deficiency anemia    Risks, benefits, limitations, and alternatives regarding endoscopy have been reviewed with the patient.  Questions have been answered.  All parties agreeable.   Jonathon Bellows, MD  03/13/2021, 8:27 AM

## 2021-03-14 ENCOUNTER — Encounter: Payer: Self-pay | Admitting: Gastroenterology

## 2021-03-15 ENCOUNTER — Encounter: Payer: Self-pay | Admitting: Family Medicine

## 2021-03-15 LAB — SURGICAL PATHOLOGY

## 2021-03-19 NOTE — Anesthesia Postprocedure Evaluation (Signed)
Anesthesia Post Note  Patient: LATORIE MONTESANO  Procedure(s) Performed: COLONOSCOPY WITH PROPOFOL ESOPHAGOGASTRODUODENOSCOPY (EGD)  Patient location during evaluation: Endoscopy Anesthesia Type: General Level of consciousness: awake and alert Pain management: pain level controlled Vital Signs Assessment: post-procedure vital signs reviewed and stable Respiratory status: spontaneous breathing, nonlabored ventilation, respiratory function stable and patient connected to nasal cannula oxygen Cardiovascular status: blood pressure returned to baseline and stable Postop Assessment: no apparent nausea or vomiting Anesthetic complications: no   No notable events documented.   Last Vitals:  Vitals:   03/13/21 0930 03/13/21 0940  BP: 119/71 128/81  Pulse: 70 77  Resp: 19 18  Temp:    SpO2: 100% 100%    Last Pain:  Vitals:   03/13/21 0910  TempSrc: Temporal  PainSc:                  Martha Clan

## 2021-03-28 ENCOUNTER — Other Ambulatory Visit: Payer: Self-pay | Admitting: Family Medicine

## 2021-03-28 DIAGNOSIS — E119 Type 2 diabetes mellitus without complications: Secondary | ICD-10-CM

## 2021-03-29 ENCOUNTER — Encounter: Payer: Self-pay | Admitting: Gastroenterology

## 2021-04-03 ENCOUNTER — Other Ambulatory Visit: Payer: Self-pay | Admitting: Family Medicine

## 2021-04-03 DIAGNOSIS — E559 Vitamin D deficiency, unspecified: Secondary | ICD-10-CM

## 2021-04-04 NOTE — Telephone Encounter (Signed)
Requested medication (s) are due for refill today: yes  Requested medication (s) are on the active medication list: yes  Last refill:  01/21/20 #12 with 4 refills  Future visit scheduled: 05/15/21  Notes to clinic:  This medication can not be delegated, please assess.    Requested Prescriptions  Pending Prescriptions Disp Refills   Vitamin D, Ergocalciferol, (DRISDOL) 1.25 MG (50000 UNIT) CAPS capsule [Pharmacy Med Name: VITAMIN D2 1.25MG (50,000 UNIT)] 12 capsule 4    Sig: TAKE 1 CAPSULE BY MOUTH ONE TIME PER WEEK     Endocrinology:  Vitamins - Vitamin D Supplementation 2 Failed - 04/03/2021  5:21 PM      Failed - Manual Review: Route requests for 50,000 IU strength to the provider      Passed - Ca in normal range and within 360 days    Calcium  Date Value Ref Range Status  06/20/2020 9.6 8.7 - 10.2 mg/dL Final          Passed - Vitamin D in normal range and within 360 days    Vit D, 25-Hydroxy  Date Value Ref Range Status  11/13/2020 81.6 30.0 - 100.0 ng/mL Final    Comment:    Vitamin D deficiency has been defined by the Institute of Medicine and an Endocrine Society practice guideline as a level of serum 25-OH vitamin D less than 20 ng/mL (1,2). The Endocrine Society went on to further define vitamin D insufficiency as a level between 21 and 29 ng/mL (2). 1. IOM (Institute of Medicine). 2010. Dietary reference    intakes for calcium and D. [place]: The    [employer]. 2. Holick MF, Binkley [place], Bischoff-Ferrari HA, et al.    Evaluation, treatment, and prevention of vitamin D    deficiency: an Endocrine Society clinical practice    guideline. JCEM. 2011 Jul; 96(7):1911-30.           Passed - Valid encounter within last 12 months    Recent Outpatient Visits           4 months ago Type 2 diabetes mellitus without complication, without long-term current use of insulin ([place])   [facility] Birdie Sons, MD   9 months ago Type 2  diabetes mellitus without complication, without long-term current use of insulin ([place])   [facility] Birdie Sons, MD   1 year ago Type 2 diabetes mellitus without complication, without long-term current use of insulin [facility])   [facility] Birdie Sons, MD   1 year ago Type 2 diabetes mellitus without complication, without long-term current use of insulin [facility])   [facility] Birdie Sons, MD   1 year ago Type 2 diabetes mellitus without complication, without long-term current use of insulin [facility])   [facility] Birdie Sons, MD       Future Appointments             In 1 month Thedore Mins, [place] [facility], [place]   In 1 month Jonathon Bellows, MD [place]

## 2021-05-14 ENCOUNTER — Ambulatory Visit: Payer: BC Managed Care – PPO | Admitting: Family Medicine

## 2021-05-14 NOTE — Progress Notes (Deleted)
?I,Kimberly Hancock,acting as a Education administrator for [employer], PA-C.,have documented all relevant documentation on the behalf of Kimberly Kirschner, PA-C,as directed by  Kimberly Kirschner, PA-C while in the presence of Kimberly Kirschner, PA-C. ? ?Established Patient Office Visit ? ?Subjective:  ?Patient ID: Kimberly Hancock, female    DOB: 06-19-1965  Age: 56 y.o. MRN: [id number] ? ?CC: No chief complaint on file. ? ? ?HPI ?Tivis Ringer presents for diabtetes follow up. ?Diabetes Mellitus Type II, Follow-up ? ?Lab Results  ?Component Value Date  ? HGBA1C 6.3 (A) 11/13/2020  ? HGBA1C 6.6 (A) 06/20/2020  ? HGBA1C 7.2 (A) 02/21/2020  ? ?Wt Readings from Last 3 Encounters:  ?03/13/21 200 lb (90.7 kg)  ?02/01/21 198 lb (89.8 kg)  ?11/13/20 205 lb (93 kg)  ? ?Last seen for diabetes 6 months ago.  ?Management since then includes continue current treatment. ?She reports {excellent/good/fair/poor:19665} compliance with treatment. ?She {is/is not:21021397} having side effects. {document side effects if present:1} ?Symptoms: ?{Yes/No:20286} fatigue {Yes/No:20286} foot ulcerations  ?{Yes/No:20286} appetite changes {Yes/No:20286} nausea  ?{Yes/No:20286} paresthesia of the feet  {Yes/No:20286} polydipsia  ?{Yes/No:20286} polyuria {Yes/No:20286} visual disturbances   ?{Yes/No:20286} vomiting   ? ? ?Home blood sugar records: {diabetes glucometry results:16657} ? ?Episodes of hypoglycemia? {Yes/No:20286} {enter symptoms and frequency of symptoms if yes:1} ?  ?Current insulin regiment: {enter 'none' or type of insulin and number of units taken with each dose of each insulin formulation that the patient is taking:1} ?Most Recent Eye Exam: *** ?{Current exercise:16438:::1} ?{Current diet habits:16563:::1} ? ?Pertinent Labs: ?Lab Results  ?Component Value Date  ? CHOL 169 06/20/2020  ? HDL 56 06/20/2020  ? [place] 95 06/20/2020  ? TRIG 99 06/20/2020  ? CHOLHDL 3.0 06/20/2020  ? Lab Results  ?Component Value Date  ? NA 141 06/20/2020  ? K 4.0 06/20/2020   ? CREATININE 0.65 06/20/2020  ? EGFR 104 06/20/2020  ? MICROALBUR negative 11/13/2020  ?  ? ?---------------------------------------------------------------------------------------------------  ? ?Past Medical History:  ?Diagnosis Date  ? ANA positive   ? COVID-19 12/2018  ? Diabetes mellitus without complication ([place])   ? Hepatomegaly   ? History of chicken pox   ? Hypothyroidism   ? Infected cyst of skin 1999  ? Osteoarthritis   ? Vitamin D deficiency   ? ? ?Past Surgical History:  ?Procedure Laterality Date  ? CESAREAN SECTION  07/03/2005  ? COLONOSCOPY WITH PROPOFOL N/A 06/28/2015  ? Procedure: COLONOSCOPY WITH PROPOFOL;  Surgeon: Robert Bellow, MD;  Location: [facility] ENDOSCOPY;  Service: Endoscopy;  Laterality: N/A;  ? COLONOSCOPY WITH PROPOFOL N/A 03/13/2021  ? Procedure: COLONOSCOPY WITH PROPOFOL;  Surgeon: Jonathon Bellows, MD;  Location: [facility] ENDOSCOPY;  Service: Gastroenterology;  Laterality: N/A;  ? ESOPHAGOGASTRODUODENOSCOPY N/A 03/13/2021  ? Procedure: ESOPHAGOGASTRODUODENOSCOPY (EGD);  Surgeon: Jonathon Bellows, MD;  Location: [facility] ENDOSCOPY;  Service: Gastroenterology;  Laterality: N/A;  ? LIPOMA EXCISION  2003  ? mid back/ Dr Bary Castilla  ? pap smear  12/28/1998  ? Normal  ? ultrasound  03/09/2012  ? Hepayomegaly. Liver= 20.6cm  ? ? ?Family History  ?Problem Relation Age of Onset  ? Diabetes Mother   ?     type 2  ? Cancer Brother 11  ?     bone passed age 43  ? Diabetes Father   ? Dementia Maternal Grandmother   ? Brain cancer Maternal Grandmother   ? Congestive Heart Failure Maternal Grandfather   ? Dementia Paternal Grandmother   ? Stroke Paternal Grandfather   ? Breast cancer  Neg Hx   ? ? ?Social History  ? ?Socioeconomic History  ? Marital status: Married  ?  Spouse name: Not on file  ? Number of children: 3  ? Years of education: Not on file  ? Highest education level: Not on file  ?Occupational History  ? Occupation: Web designer  ?  Comment: works for [employer]  ?Tobacco Use  ? Smoking status:  Never  ? Smokeless tobacco: Never  ?Substance and Sexual Activity  ? Alcohol use: No  ?  Alcohol/week: 0.0 standard drinks  ? Drug use: No  ? Sexual activity: Not on file  ?Other Topics Concern  ? Not on file  ?Social History Narrative  ? Not on file  ? ?Social Determinants of Health  ? ?Financial Resource Strain: Not on file  ?Food Insecurity: Not on file  ?Transportation Needs: Not on file  ?Physical Activity: Not on file  ?Stress: Not on file  ?Social Connections: Not on file  ?Intimate Partner Violence: Not on file  ? ? ?Outpatient Medications Prior to Visit  ?Medication Sig Dispense Refill  ? cyanocobalamin (,VITAMIN B-12,) 1000 MCG/ML injection INJECT 1 ML INTO THE MUSCLE MONTHLY 3 mL 3  ? hydrochlorothiazide (HYDRODIURIL) 25 MG tablet TAKE 1 TABLET BY MOUTH EVERY DAY 90 tablet 0  ? Insulin Syringe-Needle U-100 (B-D INSULIN SYRINGE 1CC/25GX1") 25G X 1" 1 ML MISC Use monthly for vitamin B12 injections 12 each 1  ? metFORMIN (GLUCOPHAGE) 500 MG tablet TAKE 1 TABLET BY MOUTH THREE TIMES A DAY 270 tablet 3  ? naproxen (NAPROSYN) 500 MG tablet TAKE 1 TABLET BY MOUTH EVERYDAY AT BEDTIME 90 tablet 0  ? pravastatin (PRAVACHOL) 20 MG tablet Take 1 tablet (20 mg total) by mouth daily. 90 tablet 3  ? Vitamin D, Ergocalciferol, (DRISDOL) 1.25 MG (50000 UNIT) CAPS capsule TAKE 1 CAPSULE BY MOUTH ONE TIME PER WEEK 12 capsule 4  ? ?No facility-administered medications prior to visit.  ? ? ?Allergies  ?Allergen Reactions  ? Amoxicillin Itching  ? Sulfa Antibiotics Itching and Swelling  ? ? ?ROS ?Review of Systems ? ?  ?Objective:  ?  ?Physical Exam ? ?There were no vitals taken for this visit. ?Wt Readings from Last 3 Encounters:  ?03/13/21 200 lb (90.7 kg)  ?02/01/21 198 lb (89.8 kg)  ?11/13/20 205 lb (93 kg)  ? ? ? ?Health Maintenance Due  ?Topic Date Due  ? FOOT EXAM  Never done  ? COVID-19 Vaccine (4 - Booster for Pfizer series) 04/19/2020  ? PAP SMEAR-Modifier  05/26/2020  ? OPHTHALMOLOGY EXAM  02/07/2021  ? HEMOGLOBIN  A1C  05/13/2021  ? ? ?There are no preventive care reminders to display for this patient. ? ?Lab Results  ?Component Value Date  ? TSH 2.650 02/22/2020  ? ?Lab Results  ?Component Value Date  ? WBC 8.6 11/13/2020  ? HGB 10.8 (L) 11/13/2020  ? HCT 36.0 11/13/2020  ? MCV 75 (L) 11/13/2020  ? PLT 321 11/13/2020  ? ?Lab Results  ?Component Value Date  ? NA 141 06/20/2020  ? K 4.0 06/20/2020  ? CO2 25 06/20/2020  ? GLUCOSE 87 06/20/2020  ? BUN 11 06/20/2020  ? CREATININE 0.65 06/20/2020  ? BILITOT 0.3 11/13/2020  ? ALKPHOS 88 11/13/2020  ? AST 82 (H) 11/13/2020  ? ALT 133 (H) 11/13/2020  ? PROT 7.3 11/13/2020  ? ALBUMIN 4.9 11/13/2020  ? CALCIUM 9.6 06/20/2020  ? EGFR 104 06/20/2020  ? ?Lab Results  ?Component Value Date  ?  CHOL 169 06/20/2020  ? ?Lab Results  ?Component Value Date  ? HDL 56 06/20/2020  ? ?Lab Results  ?Component Value Date  ? [place] 95 06/20/2020  ? ?Lab Results  ?Component Value Date  ? TRIG 99 06/20/2020  ? ?Lab Results  ?Component Value Date  ? CHOLHDL 3.0 06/20/2020  ? ?Lab Results  ?Component Value Date  ? HGBA1C 6.3 (A) 11/13/2020  ? ? ?  ?Assessment & Plan:  ? ?Problem List Items Addressed This Visit   ?None ? ? ?No orders of the defined types were placed in this encounter. ? ? ?Follow-up: No follow-ups on file.  ? ? ?Beverlee Nims, [place] ?

## 2021-05-15 ENCOUNTER — Encounter: Payer: Self-pay | Admitting: Physician Assistant

## 2021-05-15 ENCOUNTER — Other Ambulatory Visit: Payer: Self-pay

## 2021-05-15 ENCOUNTER — Ambulatory Visit: Payer: BC Managed Care – PPO | Admitting: Physician Assistant

## 2021-05-15 ENCOUNTER — Ambulatory Visit: Payer: BC Managed Care – PPO | Admitting: Gastroenterology

## 2021-05-15 VITALS — BP 119/61 | HR 88 | Temp 97.7°F | Resp 14 | Ht 60.0 in | Wt 196.0 lb

## 2021-05-15 DIAGNOSIS — Z8632 Personal history of gestational diabetes: Secondary | ICD-10-CM

## 2021-05-15 DIAGNOSIS — M1712 Unilateral primary osteoarthritis, left knee: Secondary | ICD-10-CM

## 2021-05-15 DIAGNOSIS — H6123 Impacted cerumen, bilateral: Secondary | ICD-10-CM | POA: Diagnosis not present

## 2021-05-15 DIAGNOSIS — E119 Type 2 diabetes mellitus without complications: Secondary | ICD-10-CM | POA: Diagnosis not present

## 2021-05-15 LAB — POCT GLYCOSYLATED HEMOGLOBIN (HGB A1C): Hemoglobin A1C: 6.3 % — AB (ref 4.0–5.6)

## 2021-05-15 MED ORDER — DICLOFENAC SODIUM 3 % EX GEL: CUTANEOUS | 1 refills | Status: DC

## 2021-05-15 NOTE — Assessment & Plan Note (Addendum)
Flushed today, clear after flushing ?

## 2021-05-15 NOTE — Assessment & Plan Note (Addendum)
Advised she can try topical diclofenac. ?Agree with avoiding NSAIDs. ?Advised if pain progresses can refer to ortho for other injectable options or back to rheum for additional w/u.  ?Pt presently declines referrals. ?

## 2021-05-15 NOTE — Progress Notes (Signed)
?  ? ? ?Established patient visit ? ? ?Patient: Kimberly Hancock   DOB: June 20, 1965   56 y.o. Female  MRN: [id number] ?Visit Date: 05/15/2021 ? ?Today's healthcare provider: [name], PA-C  ? ?Cc. DMII f/u ? ?Subjective  ?  ?HPI  ? ?Kimberly Hancock reports a long-standing problem of left knee pain. Previously, > 10 years ago, saw rheum who placed her on an unspecified injectable medication which helped tremendously. Has not taken in > 10 years. Was managing with corticosteroid injections, oral NSAIDs, PT. Having GI issues and had to d/c NSAIDs, unable to take tylenol d/t unspecified liver issues.  ?Pain is manageable but pt has concerns over long term. She denies weakness, falls, swelling, rashes. ? ?She reports her right ear feels stopped up for the past few weeks. Itching, decreased hearing, aching. Denies URI symptoms, fever.  ? ?Diabetes Mellitus Type II, Follow-up ? ?Lab Results  ?Component Value Date  ? HGBA1C 6.3 (A) 11/13/2020  ? HGBA1C 6.6 (A) 06/20/2020  ? HGBA1C 7.2 (A) 02/21/2020  ? ?Wt Readings from Last 3 Encounters:  ?05/15/21 196 lb (88.9 kg)  ?03/13/21 200 lb (90.7 kg)  ?02/01/21 198 lb (89.8 kg)  ? ?Last seen for diabetes 6 months ago.  ?Management since then includes Metformin 500 mg TID; well controlled  ?She reports good compliance with treatment. ?She is not having side effects.  ?Symptoms: ?No fatigue No foot ulcerations  ?No appetite changes No nausea  ?No paresthesia of the feet  No polydipsia  ?No polyuria No visual disturbances   ?No vomiting   ? ? ?Home blood sugar records:  Not checked ? ?Episodes of hypoglycemia? No  ? ?Most Recent Eye Exam: February 26, 2021 Dr. Lorie Hancock ?Current exercise: walking ?Current diet habits: in general, a "healthy" diet   ? ?Pertinent Labs: ?Lab Results  ?Component Value Date  ? CHOL 169 06/20/2020  ? HDL 56 06/20/2020  ? [place] 95 06/20/2020  ? TRIG 99 06/20/2020  ? CHOLHDL 3.0 06/20/2020  ? Lab Results  ?Component Value Date  ? NA 141 06/20/2020  ? K 4.0  06/20/2020  ? CREATININE 0.65 06/20/2020  ? EGFR 104 06/20/2020  ? MICROALBUR negative 11/13/2020  ?  ? ?--------------------------------------------------------------------------------------------------- ? ?Medications: ?Outpatient Medications Prior to Visit  ?Medication Sig  ? cyanocobalamin (,VITAMIN B-12,) 1000 MCG/ML injection INJECT 1 ML INTO THE MUSCLE MONTHLY  ? fluconazole (DIFLUCAN) 200 MG tablet Take 200 mg by mouth once a week.  ? hydrochlorothiazide (HYDRODIURIL) 25 MG tablet TAKE 1 TABLET BY MOUTH EVERY DAY  ? Insulin Syringe-Needle U-100 (B-D INSULIN SYRINGE 1CC/25GX1") 25G X 1" 1 ML MISC Use monthly for vitamin B12 injections  ? metFORMIN (GLUCOPHAGE) 500 MG tablet TAKE 1 TABLET BY MOUTH THREE TIMES A DAY  ? pravastatin (PRAVACHOL) 20 MG tablet Take 1 tablet (20 mg total) by mouth daily.  ? Vitamin D, Ergocalciferol, (DRISDOL) 1.25 MG (50000 UNIT) CAPS capsule TAKE 1 CAPSULE BY MOUTH ONE TIME PER WEEK  ? [DISCONTINUED] naproxen (NAPROSYN) 500 MG tablet TAKE 1 TABLET BY MOUTH EVERYDAY AT BEDTIME (Patient not taking: Reported on 05/15/2021)  ? ?No facility-administered medications prior to visit.  ? ? ?Review of Systems  ?Constitutional:  Negative for fatigue and fever.  ?HENT:  Positive for hearing loss.   ?Respiratory:  Negative for cough and shortness of breath.   ?Cardiovascular:  Negative for chest pain and leg swelling.  ?Gastrointestinal:  Negative for abdominal pain.  ?Musculoskeletal:  Positive for arthralgias.  ?Neurological:  Negative  for dizziness and headaches.  ? ? ?  Objective  ?  ?Blood pressure 119/61, pulse 88, temperature 97.7 ?F (36.5 ?C), temperature source Oral, resp. rate 14, height 5' (1.524 m), weight 196 lb (88.9 kg), SpO2 100 %.  ? ?Physical Exam ?Constitutional:   ?   General: She is awake.  ?   Appearance: She is well-developed.  ?HENT:  ?   Head: Normocephalic.  ?   Right Ear: There is impacted cerumen.  ?   Left Ear: There is impacted cerumen.  ?   Ears:  ?   Comments: B/l  TM clear after flushing ?Eyes:  ?   Conjunctiva/sclera: Conjunctivae normal.  ?Cardiovascular:  ?   Rate and Rhythm: Normal rate and regular rhythm.  ?   Pulses:     ?     Dorsalis pedis pulses are 3+ on the right side and 3+ on the left side.  ?   Heart sounds: Normal heart sounds.  ?Pulmonary:  ?   Effort: Pulmonary effort is normal.  ?   Breath sounds: Normal breath sounds.  ?Feet:  ?   Right foot:  ?   Protective Sensation: 3 sites tested.  3 sites sensed.  ?   Skin integrity: Skin integrity normal.  ?   Toenail Condition: Right toenails are abnormally thick and long.  ?   Left foot:  ?   Protective Sensation: 3 sites tested.  3 sites sensed.  ?   Skin integrity: Skin integrity normal.  ?   Toenail Condition: Left toenails are abnormally thick and long.  ?Skin: ?   General: Skin is warm.  ?Neurological:  ?   Mental Status: She is alert and oriented to person, place, and time.  ?Psychiatric:     ?   Attention and Perception: Attention normal.     ?   Mood and Affect: Mood normal.     ?   Speech: Speech normal.     ?   Behavior: Behavior is cooperative.  ?  ? ?No results found for any visits on 05/15/21. ? Assessment & Plan  ?  ? ?Reports eye exam 1/23 will obtain records. ?GYN visit within the year as well, will obtain records. ? ?Problem List Items Addressed This Visit   ? ?  ? Endocrine  ? Type 2 diabetes mellitus without complication, without long-term current use of insulin ([place]) - Primary  ?  Currently taking Metformin 500 mg TID. Or 1.5 pills BID, regardless same dose daily. ?Today's A1C 6.3%. Well controlled continue metformin at current dose. ?Urine obtained today for albumin/creat ratio  ?F/u 4 mo  ?  ?  ? Relevant Orders  ? Urine Albumin-Creatinine with uACR  ? POCT HgB A1C  ?  ? Nervous and Auditory  ? Impacted cerumen of both ears  ?  Flushed today, clear after flushing ?  ?  ?  ? Musculoskeletal and Integument  ? Arthritis of left knee  ?  Advised she can try topical diclofenac. ?Agree with avoiding  NSAIDs. ?Advised if pain progresses can refer to ortho for other injectable options or back to rheum for additional w/u.  ?Pt presently declines referrals. ?  ?  ? Relevant Medications  ? Diclofenac Sodium 3 % GEL  ?  ?Return in about 4 months (around 09/14/2021) for DMII, hypertension.  ?   ?I, Kimberly Kirschner, PA-C have reviewed all documentation for this visit. The documentation on  05/15/2021 for the exam, diagnosis, procedures, and orders are  all accurate and complete.  ? ?Kimberly Kirschner, PA-C ?[facility] ?[id number] ?[place], [place], [address] ?Office: [phone] ?Fax: [phone]  ? ?[facility] ?

## 2021-05-15 NOTE — Assessment & Plan Note (Addendum)
Currently taking Metformin 500 mg TID. Or 1.5 pills BID, regardless same dose daily. ?Today's A1C 6.3%. Well controlled continue metformin at current dose. ?Urine obtained today for albumin/creat ratio  ?F/u 4 mo  ?

## 2021-05-16 LAB — MICROALBUMIN / CREATININE URINE RATIO
Creatinine, Urine: 63.8 mg/dL
Microalb/Creat Ratio: <5 mg/g creat (ref 0–29)
Microalbumin, Urine: <3 ug/mL

## 2021-05-16 LAB — SPECIMEN STATUS REPORT

## 2021-05-17 ENCOUNTER — Ambulatory Visit: Payer: BC Managed Care – PPO | Admitting: Gastroenterology

## 2021-05-17 ENCOUNTER — Encounter: Payer: Self-pay | Admitting: Gastroenterology

## 2021-05-17 ENCOUNTER — Other Ambulatory Visit: Payer: Self-pay

## 2021-05-17 VITALS — BP 134/83 | HR 92 | Temp 97.9°F | Ht 60.0 in | Wt 199.0 lb

## 2021-05-17 DIAGNOSIS — D509 Iron deficiency anemia, unspecified: Secondary | ICD-10-CM | POA: Diagnosis not present

## 2021-05-17 NOTE — Progress Notes (Signed)
?  ?Jonathon Bellows MD, MRCP(U.K) ?[address]  ?[address]  ?[place], [place] [address]  ?Main: [phone]  ?Fax: [phone] ? ? ?Primary Care Physician: Birdie Sons, MD ? ?Primary Gastroenterologist:  Dr. Jonathon Bellows  ? ?Chief Complaint  ?Patient presents with  ? Go over procedure results  ? ? ?HPI: Kimberly Hancock is a 56 y.o. female ? ? ?Summary of history : ? ?She is a patient was seen previously by Dr. Bonna Gains back in December 2022 when she was referred for a FOBT positive stool.  This was done because the patient had microcytic anemia. ?Subsequently had a ferritin of 18 and iron saturation of less than 5.  TIBC was elevated.  Suggesting iron deficiency. ? ? ?Interval history   02/13/2021-05/17/2021 ? ?03/13/2021: EGD: 12 mm polyp was seen in the greater curvature the stomach resected via EMR.  I also performed a colonoscopy on the same day and 2 polyps 3 and 5 mm in size were resected from the ascending and transverse colon respectively.  No abnormalities were seen.  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 ? ?11/13/2020 hemoglobin 10.8 g and MCV of 75 ? ?She says she was never put on any iron and has not taken any so far. She recalls she had been taking Naprosyn for some years for body aches and has stopped that just before the endoscopy and has not yet restarted. Denies any overt blood loss. ? ?Current Outpatient Medications  ?Medication Sig Dispense Refill  ? cyanocobalamin (,VITAMIN B-12,) 1000 MCG/ML injection INJECT 1 ML INTO THE MUSCLE MONTHLY 3 mL 3  ? fluconazole (DIFLUCAN) 200 MG tablet Take 200 mg by mouth once a week.    ? hydrochlorothiazide (HYDRODIURIL) 25 MG tablet TAKE 1 TABLET BY MOUTH EVERY DAY 90 tablet 0  ? metFORMIN (GLUCOPHAGE) 500 MG tablet TAKE 1 TABLET BY MOUTH THREE TIMES A DAY 270 tablet 3  ? pravastatin (PRAVACHOL) 20 MG tablet Take 1 tablet (20 mg total) by mouth daily. 90 tablet 3  ? Vitamin D, Ergocalciferol, (DRISDOL)  1.25 MG (50000 UNIT) CAPS capsule TAKE 1 CAPSULE BY MOUTH ONE TIME PER WEEK 12 capsule 4  ? ?No current facility-administered medications for this visit.  ? ? ?Allergies as of 05/17/2021 - Review Complete 05/17/2021  ?Allergen Reaction Noted  ? Amoxicillin Itching 11/29/2013  ? Sulfa antibiotics Itching and Swelling 11/29/2013  ? ? ?ROS: ? ?General: Negative for anorexia, weight loss, fever, chills, fatigue, weakness. ?ENT: Negative for hoarseness, difficulty swallowing , nasal congestion. ?CV: Negative for chest pain, angina, palpitations, dyspnea on exertion, peripheral edema.  ?Respiratory: Negative for dyspnea at rest, dyspnea on exertion, cough, sputum, wheezing.  ?GI: See history of present illness. ?GU:  Negative for dysuria, hematuria, urinary incontinence, urinary frequency, nocturnal urination.  ?Endo: Negative for unusual weight change.  ?  ?Physical Examination: ? ? BP 134/83   Pulse 92   Temp 97.9 ?F (36.6 ?C) (Oral)   Ht 5' (1.524 m)   Wt 199 lb (90.3 kg)   BMI 38.86 kg/m?  ? ?General: Well-nourished, well-developed in no acute distress.  ?Eyes: No icterus. Conjunctivae pink. ?Mouth: Oropharyngeal mucosa moist and pink , no lesions erythema or exudate. ?Neuro: Alert and oriented x 3.  Grossly intact. ?Skin: Warm and dry, no jaundice.   ?Psych: Alert and cooperative, normal mood and affect. ? ? ?Imaging Studies: ?No results found. ? ?Assessment and Plan:  ? ?Kimberly Hancock is  a 56 y.o. y/o female here today to see me for iron deficiency anemia.  EGD and colonoscopy showed a tubular adenoma which was 5 mm in size and a gastric polyp that was hyperplastic that was resected.  No other abnormalities were seen to explain the iron deficiency anemia.  It is very likely that her anemia is from chronic NSAID use which she has stopped and I advised her to continue to do so. ? ?Plan ?1.  Check CBC and iron studies to see if there is improvement if not will require IV iron ?2.  Check B12, folate, H. pylori  breath test, urine analysis. ?3.  Capsule study of the small bowel was recommended but she says she cannot physically swallow the capsule.  Hence I discussed that we will check her iron studies if it is extremely low then we will have to do an endoscopic placement of the capsule otherwise we can watch closely ? ? ?Dr Jonathon Bellows  MD,MRCP [facility]) ?Follow up in 3 months  ?

## 2021-05-18 ENCOUNTER — Other Ambulatory Visit: Payer: BC Managed Care – PPO

## 2021-05-18 ENCOUNTER — Telehealth: Payer: Self-pay

## 2021-05-18 DIAGNOSIS — D509 Iron deficiency anemia, unspecified: Secondary | ICD-10-CM

## 2021-05-18 NOTE — Telephone Encounter (Signed)
Called patient to let her know what Dr. Vicente Males recommended for her to do and she agreed. Therefore, I will send the referral to hematology and schedule her for a capsule study.  ?

## 2021-05-18 NOTE — Progress Notes (Signed)
[place] ? ?Inform  ? ?1. Iron levels very low refer to hematology for IV iron  since she cannot swallow pills she states ? ?2. B12 borderline low - recommend b12 shots or oral pill if she can swallow ? ?3. Suggest capsule study of the small bowel since she cannot swallow we can do it endoscopically and release it inside the small bowel - if agrees set her up  ? ?C/c Birdie Sons, MD (FYI) ? ? ?Dr Jonathon Bellows MD,MRCP [facility]) ?Gastroenterology/Hepatology ?Pager: [phone] ?

## 2021-05-18 NOTE — Telephone Encounter (Signed)
-----   Message from Jonathon Bellows, MD sent at 05/18/2021  9:10 AM EDT ----- ?Kimberly Hancock ? ?Inform  ? ?1. Iron levels very low refer to hematology for IV iron  since she cannot swallow pills she states ? ?2. B12 borderline low - recommend b12 shots or oral pill if she can swallow ? ?3. Suggest capsule study of the small bowel since she cannot swallow we can do it endoscopically and release it inside the small bowel - if agrees set her up  ? ?C/c Birdie Sons, MD (FYI) ? ? ?Dr Jonathon Bellows MD,MRCP [facility]) ?Gastroenterology/Hepatology ?Pager: [phone] ? ?

## 2021-05-19 LAB — H. PYLORI BREATH TEST: H pylori Breath Test: NEGATIVE

## 2021-05-20 LAB — IRON,TIBC AND FERRITIN PANEL
Ferritin: 12 ng/mL — ABNORMAL LOW (ref 15–150)
Iron Saturation: 5 % — CL (ref 15–55)
Iron: 26 ug/dL — ABNORMAL LOW (ref 27–159)
Total Iron Binding Capacity: 477 ug/dL — ABNORMAL HIGH (ref 250–450)
UIBC: 451 ug/dL — ABNORMAL HIGH (ref 131–425)

## 2021-05-20 LAB — CELIAC DISEASE AB SCREEN W/RFX
Antigliadin Abs, IgA: 6 units (ref 0–19)
IgA/Immunoglobulin A, Serum: 234 mg/dL (ref 87–352)
Transglutaminase IgA: <2 U/mL (ref 0–3)

## 2021-05-20 LAB — URINALYSIS
Bilirubin, UA: NEGATIVE
Glucose, UA: NEGATIVE
Ketones, UA: NEGATIVE
Nitrite, UA: NEGATIVE
Protein,UA: NEGATIVE
RBC, UA: NEGATIVE
Specific Gravity, UA: 1.01 (ref 1.005–1.030)
Urobilinogen, Ur: 1 mg/dL (ref 0.2–1.0)
pH, UA: 6.5 (ref 5.0–7.5)

## 2021-05-20 LAB — B12 AND FOLATE PANEL
Folate: 10.4 ng/mL (ref >3.0)
Vitamin B-12: 290 pg/mL (ref 232–1245)

## 2021-05-21 NOTE — Telephone Encounter (Signed)
Patient left vm requesting call back from [place]. Patient states she has a few more questions about her results.  ?

## 2021-05-22 ENCOUNTER — Telehealth: Payer: Self-pay

## 2021-05-22 NOTE — Telephone Encounter (Signed)
Patient called and wants to let you know about some changes that she  has had and wants you to call her back  ?

## 2021-05-23 ENCOUNTER — Telehealth: Payer: Self-pay | Admitting: Gastroenterology

## 2021-05-23 NOTE — Telephone Encounter (Signed)
Called patient back and she stated that she has difficulty swallowing pills and she does not believe that she could swallow the givens capsule. Therefore, she is cancelling it and wants to know what is plan B. Please advise. ?

## 2021-05-23 NOTE — Telephone Encounter (Signed)
Patient left vm to cancel and reschedule a procedure, requesting call back. ?

## 2021-05-24 ENCOUNTER — Encounter: Payer: Self-pay | Admitting: Physician Assistant

## 2021-05-24 NOTE — Telephone Encounter (Signed)
We can perform endoscopic placement and she wont need to swallow

## 2021-05-25 NOTE — Telephone Encounter (Signed)
Called patient back to let her know that Dr. Vicente Males is recommending for her to have an endoscopy placement done since she can't swallow a pill. Patient agreed but she is currently sick with a cold. Therefore, she will call us back until she feels better. ?Patient will need an EGD and capsule study to be billed. ?

## 2021-05-29 ENCOUNTER — Encounter: Admission: RE | Payer: Self-pay | Source: Home / Self Care

## 2021-05-29 ENCOUNTER — Ambulatory Visit
Admission: RE | Admit: 2021-05-29 | Payer: BC Managed Care – PPO | Source: Home / Self Care | Admitting: Gastroenterology

## 2021-05-29 SURGERY — IMAGING PROCEDURE, GI TRACT, INTRALUMINAL, VIA CAPSULE

## 2021-05-31 ENCOUNTER — Telehealth: Payer: Self-pay | Admitting: Gastroenterology

## 2021-05-31 NOTE — Telephone Encounter (Signed)
Pt left message to reschhed procedure ?

## 2021-06-04 ENCOUNTER — Inpatient Hospital Stay: Payer: BC Managed Care – PPO

## 2021-06-04 ENCOUNTER — Inpatient Hospital Stay: Payer: BC Managed Care – PPO | Attending: Oncology | Admitting: Oncology

## 2021-06-04 ENCOUNTER — Encounter: Payer: Self-pay | Admitting: Oncology

## 2021-06-04 ENCOUNTER — Other Ambulatory Visit: Payer: Self-pay

## 2021-06-04 VITALS — BP 127/76 | HR 82 | Temp 98.2°F | Resp 16 | Ht 60.0 in | Wt 194.3 lb

## 2021-06-04 DIAGNOSIS — D509 Iron deficiency anemia, unspecified: Secondary | ICD-10-CM | POA: Insufficient documentation

## 2021-06-04 DIAGNOSIS — E785 Hyperlipidemia, unspecified: Secondary | ICD-10-CM | POA: Diagnosis not present

## 2021-06-04 DIAGNOSIS — R531 Weakness: Secondary | ICD-10-CM | POA: Diagnosis not present

## 2021-06-04 DIAGNOSIS — I10 Essential (primary) hypertension: Secondary | ICD-10-CM | POA: Diagnosis not present

## 2021-06-04 DIAGNOSIS — E119 Type 2 diabetes mellitus without complications: Secondary | ICD-10-CM | POA: Insufficient documentation

## 2021-06-04 DIAGNOSIS — Z86010 Personal history of colonic polyps: Secondary | ICD-10-CM | POA: Diagnosis not present

## 2021-06-04 DIAGNOSIS — Z8616 Personal history of COVID-19: Secondary | ICD-10-CM | POA: Diagnosis not present

## 2021-06-04 DIAGNOSIS — D559 Anemia due to enzyme disorder, unspecified: Secondary | ICD-10-CM | POA: Diagnosis not present

## 2021-06-04 DIAGNOSIS — E559 Vitamin D deficiency, unspecified: Secondary | ICD-10-CM | POA: Insufficient documentation

## 2021-06-04 DIAGNOSIS — Z79899 Other long term (current) drug therapy: Secondary | ICD-10-CM | POA: Diagnosis not present

## 2021-06-04 DIAGNOSIS — M129 Arthropathy, unspecified: Secondary | ICD-10-CM | POA: Diagnosis not present

## 2021-06-04 DIAGNOSIS — E039 Hypothyroidism, unspecified: Secondary | ICD-10-CM | POA: Diagnosis not present

## 2021-06-04 DIAGNOSIS — R5383 Other fatigue: Secondary | ICD-10-CM | POA: Diagnosis not present

## 2021-06-04 DIAGNOSIS — Z803 Family history of malignant neoplasm of breast: Secondary | ICD-10-CM | POA: Diagnosis not present

## 2021-06-04 DIAGNOSIS — M1990 Unspecified osteoarthritis, unspecified site: Secondary | ICD-10-CM | POA: Insufficient documentation

## 2021-06-04 LAB — CBC WITH DIFFERENTIAL/PLATELET
Abs Immature Granulocytes: 0.05 10*3/uL (ref 0.00–0.07)
Basophils Absolute: 0.1 10*3/uL (ref 0.0–0.1)
Basophils Relative: 1 %
Eosinophils Absolute: 0.3 10*3/uL (ref 0.0–0.5)
Eosinophils Relative: 4 %
HCT: 35.9 % — ABNORMAL LOW (ref 36.0–46.0)
Hemoglobin: 10.1 g/dL — ABNORMAL LOW (ref 12.0–15.0)
Immature Granulocytes: 1 %
Lymphocytes Relative: 24 %
Lymphs Abs: 2.1 10*3/uL (ref 0.7–4.0)
MCH: 20.2 pg — ABNORMAL LOW (ref 26.0–34.0)
MCHC: 28.1 g/dL — ABNORMAL LOW (ref 30.0–36.0)
MCV: 71.8 fL — ABNORMAL LOW (ref 80.0–100.0)
Monocytes Absolute: 0.6 10*3/uL (ref 0.1–1.0)
Monocytes Relative: 7 %
Neutro Abs: 5.5 10*3/uL (ref 1.7–7.7)
Neutrophils Relative %: 63 %
Platelets: 350 10*3/uL (ref 150–400)
RBC: 5 MIL/uL (ref 3.87–5.11)
RDW: 19.5 % — ABNORMAL HIGH (ref 11.5–15.5)
WBC: 8.6 10*3/uL (ref 4.0–10.5)
nRBC: 0 % (ref 0.0–0.2)

## 2021-06-04 NOTE — Progress Notes (Addendum)
? ?Hematology/Oncology Consult note ?[place] ?[phone] [phone] ? ?Patient Care Team: ?Birdie Sons, MD as PCP - General (Family Medicine) ?Robert Bellow, MD (General Surgery) ?Emmaline Kluver., MD (Rheumatology) ?Mosetta Anis, MD as Referring Physician (Allergy) ?Barrie Dunker, MD (Dermatology) ?Ward, Honor Loh, MD as Referring Physician (Obstetrics and Gynecology) ?Lorelee Cover., MD (Ophthalmology)  ? ?Name of the patient: Kimberly Hancock  ?[id number]  ?[birth year]  ? ? ?Reason for referral-iron deficiency anemia ?  ?Referring physician- Dr. Jonathon Bellows ? ?Date of visit: 06/04/21 ? ? ?History of presenting illness-patient is a 56 year old female with a past medical history significant for hypertension hyperlipidemia type 2 diabetes who has been referred forIron deficiency anemia.  She was seen by Dr. Vicente Males from GI in March 2023 she has had EGD and colonoscopy in the past which showed a tubular adenoma 5 mm in size and a hyperplastic gastric polyp..  She has not undergone capsule study in the past because of inability to swallow the capsule pill.  Further GI work-up for iron deficiency anemia by GI showed negative H. pylori breath testing, negative celiac disease panel.  Iron studies showed a low ferritin of 12 with iron saturation of 5% and elevated TIBC of 477.  Last CBC was from September 2022 when it was 10.8 with an MCV of 75.  B12 levels were mildly low at 290 and folate level normal at 10.4. ? ?Patient has been menopausal for the last 3 years.  Denies any vaginal bleeding.  Reports ongoing fatigue.  Denies any family history of colon cancer.  Denies any consistent use of NSAIDs.  She is fearful of pills in general but has not tried oral iron.  She self administers monthly B12 injections. ? ?ECOG PS- 0 ? ?Pain scale- 0 ? ? ?Review of systems- Review of Systems  ?Constitutional:  Positive for malaise/fatigue. Negative for chills, fever and  weight loss.  ?HENT:  Negative for congestion, ear discharge and nosebleeds.   ?Eyes:  Negative for blurred vision.  ?Respiratory:  Negative for cough, hemoptysis, sputum production, shortness of breath and wheezing.   ?Cardiovascular:  Negative for chest pain, palpitations, orthopnea and claudication.  ?Gastrointestinal:  Negative for abdominal pain, blood in stool, constipation, diarrhea, heartburn, melena, nausea and vomiting.  ?Genitourinary:  Negative for dysuria, flank pain, frequency, hematuria and urgency.  ?Musculoskeletal:  Negative for back pain, joint pain and myalgias.  ?Skin:  Negative for rash.  ?Neurological:  Negative for dizziness, tingling, focal weakness, seizures, weakness and headaches.  ?Endo/Heme/Allergies:  Does not bruise/bleed easily.  ?Psychiatric/Behavioral:  Negative for depression and suicidal ideas. The patient does not have insomnia.   ? ?Allergies  ?Allergen Reactions  ? Amoxicillin Itching  ? Sulfa Antibiotics Itching and Swelling  ? ? ?Patient Active Problem List  ? Diagnosis Date Noted  ? Arthritis of left knee 05/15/2021  ? Impacted cerumen of both ears 05/15/2021  ? Iron deficiency anemia 11/20/2020  ? History of adenomatous polyp of colon 11/20/2020  ? B12 deficiency 04/05/2020  ? Vitamin D deficiency 01/21/2020  ? Type 2 diabetes mellitus without complication, without long-term current use of insulin ([place]) 08/17/2019  ? Graves disease 06/15/2015  ? Elevated LFTs 06/07/2015  ? History of gestational diabetes 06/07/2015  ? Obesity 06/07/2015  ? Osteoarthrosis 06/07/2015  ? Breast pain, left 09/17/2014  ? Hepatomegaly 03/09/2012  ? History of elevated antinuclear antibody (ANA) 08/14/2010  ? ? ? ?Past Medical History:  ?Diagnosis Date  ?  ANA positive   ? COVID-19 12/2018  ? Diabetes mellitus without complication ([place])   ? Hepatomegaly   ? History of chicken pox   ? Hypothyroidism   ? Infected cyst of skin 1999  ? Osteoarthritis   ? Vitamin D deficiency   ? ? ? ?Past Surgical  History:  ?Procedure Laterality Date  ? CESAREAN SECTION  07/03/2005  ? COLONOSCOPY WITH PROPOFOL N/A 06/28/2015  ? Procedure: COLONOSCOPY WITH PROPOFOL;  Surgeon: Robert Bellow, MD;  Location[facility] ENDOSCOPY;  Service: Endoscopy;  Laterality: N/A;  ? COLONOSCOPY WITH PROPOFOL N/A 03/13/2021  ? Procedure: COLONOSCOPY WITH PROPOFOL;  Surgeon: Jonathon Bellows, MD;  Location[facility] ENDOSCOPY;  Service: Gastroenterology;  Laterality: N/A;  ? ESOPHAGOGASTRODUODENOSCOPY N/A 03/13/2021  ? Procedure: ESOPHAGOGASTRODUODENOSCOPY (EGD);  Surgeon: Jonathon Bellows, MD;  Location: [facility] ENDOSCOPY;  Service: Gastroenterology;  Laterality: N/A;  ? LIPOMA EXCISION  2003  ? mid back/ Dr Bary Castilla  ? pap smear  12/28/1998  ? Normal  ? ultrasound  03/09/2012  ? Hepayomegaly. Liver= 20.6cm  ? ? ?Social History  ? ?Socioeconomic History  ? Marital status: Married  ?  Spouse name: Not on file  ? Number of children: 3  ? Years of education: Not on file  ? Highest education level: Not on file  ?Occupational History  ? Occupation: Web designer  ?  Comment: works for [employer]  ?Tobacco Use  ? Smoking status: Never  ? Smokeless tobacco: Never  ?Substance and Sexual Activity  ? Alcohol use: No  ?  Alcohol/week: 0.0 standard drinks  ? Drug use: No  ? Sexual activity: Not on file  ?Other Topics Concern  ? Not on file  ?Social History Narrative  ? Not on file  ? ?Social Determinants of Health  ? ?Financial Resource Strain: Not on file  ?Food Insecurity: Not on file  ?Transportation Needs: Not on file  ?Physical Activity: Not on file  ?Stress: Not on file  ?Social Connections: Not on file  ?Intimate Partner Violence: Not on file  ? ?  ?Family History  ?Problem Relation Age of Onset  ? Diabetes Mother   ?     type 2  ? Cancer Brother 11  ?     bone passed age 39  ? Diabetes Father   ? Dementia Maternal Grandmother   ? Brain cancer Maternal Grandmother   ? Congestive Heart Failure Maternal Grandfather   ? Dementia Paternal Grandmother   ? Stroke  Paternal Grandfather   ? Breast cancer Neg Hx   ? ? ? ?Current Outpatient Medications:  ?  cyanocobalamin (,VITAMIN B-12,) 1000 MCG/ML injection, INJECT 1 ML INTO THE MUSCLE MONTHLY, Disp: 3 mL, Rfl: 3 ?  fluconazole (DIFLUCAN) 200 MG tablet, Take 200 mg by mouth once a week., Disp: , Rfl:  ?  hydrochlorothiazide (HYDRODIURIL) 25 MG tablet, TAKE 1 TABLET BY MOUTH EVERY DAY, Disp: 90 tablet, Rfl: 0 ?  metFORMIN (GLUCOPHAGE) 500 MG tablet, TAKE 1 TABLET BY MOUTH THREE TIMES A DAY, Disp: 270 tablet, Rfl: 3 ?  pravastatin (PRAVACHOL) 20 MG tablet, Take 1 tablet (20 mg total) by mouth daily., Disp: 90 tablet, Rfl: 3 ?  Vitamin D, Ergocalciferol, (DRISDOL) 1.25 MG (50000 UNIT) CAPS capsule, TAKE 1 CAPSULE BY MOUTH ONE TIME PER WEEK, Disp: 12 capsule, Rfl: 4 ? ? ?Physical exam:  ?Vitals:  ? 06/04/21 1329  ?BP: 127/76  ?Pulse: 82  ?Resp: 16  ?Temp: 98.2 ?F (36.8 ?C)  ?TempSrc: Oral  ?Weight: 194 lb  4.8 oz (88.1 kg)  ?Height: 5' (1.524 m)  ? ?Physical Exam ?Constitutional:   ?   General: She is not in acute distress. ?Cardiovascular:  ?   Rate and Rhythm: Normal rate and regular rhythm.  ?   Heart sounds: Normal heart sounds.  ?Pulmonary:  ?   Effort: Pulmonary effort is normal.  ?   Breath sounds: Normal breath sounds.  ?Abdominal:  ?   General: Bowel sounds are normal.  ?   Palpations: Abdomen is soft.  ?Musculoskeletal:  ?   Cervical back: Normal range of motion.  ?Skin: ?   General: Skin is warm and dry.  ?Neurological:  ?   Mental Status: She is alert and oriented to person, place, and time.  ?  ? ? ? ? ?  Latest Ref Rng & Units 11/13/2020  ?  2:27 PM  ?CMP  ?Total Protein 6.0 - 8.5 g/dL 7.3    ?Total Bilirubin 0.0 - 1.2 mg/dL 0.3    ?Alkaline Phos 44 - 121 IU/L 88    ?AST 0 - 40 IU/L 82    ?ALT 0 - 32 IU/L 133    ? ? ?  Latest Ref Rng & Units 11/13/2020  ?  2:27 PM  ?CBC  ?WBC 3.4 - 10.8 x10E3/uL 8.6    ?Hemoglobin 11.1 - 15.9 g/dL 10.8    ?Hematocrit 34.0 - 46.6 % 36.0    ?Platelets 150 - 450 x10E3/uL 321    ? ? ?No  images are attached to the encounter. ? ?No results found. ? ?Assessment and plan- Patient is a 56 y.o. female referred for iron deficiency anemia ? ?Patient has not had a recent CBC checked and we will procee

## 2021-06-04 NOTE — Telephone Encounter (Signed)
Spoke with pt and rescheduled her Capsule endoscopy to April 19th.  ?

## 2021-06-04 NOTE — Addendum Note (Signed)
Addended by: Luella Cook on: 06/04/2021 02:36 PM ? ? Modules accepted: Orders ? ?

## 2021-06-04 NOTE — Progress Notes (Signed)
Pt is tired but has been that way for a while so it is become her normal. She is never tried oral iron. ?

## 2021-06-06 ENCOUNTER — Encounter: Payer: Self-pay | Admitting: Gastroenterology

## 2021-06-06 ENCOUNTER — Telehealth: Payer: Self-pay | Admitting: Oncology

## 2021-06-06 NOTE — Telephone Encounter (Signed)
Per previous conversation with patient, she would like to hold off on Venofer infusions until confirming cost with her insurance carrier. Attempt made to reach patient to f/y. No answer, left VM.  ?

## 2021-06-08 ENCOUNTER — Encounter: Payer: Self-pay | Admitting: *Deleted

## 2021-06-12 ENCOUNTER — Telehealth: Payer: Self-pay | Admitting: *Deleted

## 2021-06-12 ENCOUNTER — Other Ambulatory Visit: Payer: Self-pay | Admitting: Oncology

## 2021-06-12 NOTE — Telephone Encounter (Signed)
Anderson Malta and I called pt about if she wants oral iron or IV iron. She was going to speak with insurance and let us know. I called today and she told me that insurance was something that was crazy and did not want to bore me wit details she sais. She is agreeable with IV iron and she spoke to [facility] today and I did not know that was done and she will start next mon. She did ask if it had been approved and I looked at the orders and it has been approved. Pt was thankful for the call ?

## 2021-06-13 ENCOUNTER — Ambulatory Visit: Payer: BC Managed Care – PPO | Admitting: Certified Registered"

## 2021-06-13 ENCOUNTER — Encounter: Payer: Self-pay | Admitting: Gastroenterology

## 2021-06-13 ENCOUNTER — Encounter
Admission: RE | Disposition: A | Discharge status: Home / Self Care | Payer: Self-pay | Source: Home / Self Care | Attending: Gastroenterology

## 2021-06-13 ENCOUNTER — Ambulatory Visit
Admission: RE | Admit: 2021-06-13 | Discharge: 2021-06-13 | Disposition: A | Discharge status: Home / Self Care | Payer: BC Managed Care – PPO | Attending: Gastroenterology | Admitting: Gastroenterology

## 2021-06-13 DIAGNOSIS — D509 Iron deficiency anemia, unspecified: Secondary | ICD-10-CM

## 2021-06-13 DIAGNOSIS — Z8616 Personal history of COVID-19: Secondary | ICD-10-CM | POA: Insufficient documentation

## 2021-06-13 DIAGNOSIS — R1310 Dysphagia, unspecified: Secondary | ICD-10-CM | POA: Diagnosis present

## 2021-06-13 DIAGNOSIS — D500 Iron deficiency anemia secondary to blood loss (chronic): Secondary | ICD-10-CM

## 2021-06-13 DIAGNOSIS — M1990 Unspecified osteoarthritis, unspecified site: Secondary | ICD-10-CM | POA: Diagnosis not present

## 2021-06-13 DIAGNOSIS — E119 Type 2 diabetes mellitus without complications: Secondary | ICD-10-CM | POA: Diagnosis not present

## 2021-06-13 DIAGNOSIS — E039 Hypothyroidism, unspecified: Secondary | ICD-10-CM | POA: Insufficient documentation

## 2021-06-13 DIAGNOSIS — E559 Vitamin D deficiency, unspecified: Secondary | ICD-10-CM | POA: Insufficient documentation

## 2021-06-13 DIAGNOSIS — Z7984 Long term (current) use of oral hypoglycemic drugs: Secondary | ICD-10-CM | POA: Insufficient documentation

## 2021-06-13 HISTORY — PX: ESOPHAGOGASTRODUODENOSCOPY (EGD) WITH PROPOFOL: SHX5813

## 2021-06-13 HISTORY — PX: GIVENS CAPSULE STUDY: SHX5432

## 2021-06-13 LAB — GLUCOSE, CAPILLARY: Glucose-Capillary: 126 mg/dL — ABNORMAL HIGH (ref 70–99)

## 2021-06-13 SURGERY — ESOPHAGOGASTRODUODENOSCOPY (EGD) WITH PROPOFOL
Anesthesia: General

## 2021-06-13 MED ORDER — GLYCOPYRROLATE 0.2 MG/ML IJ SOLN
INTRAMUSCULAR | Status: DC | PRN
  Administered 2021-06-13: .2 mg via INTRAVENOUS

## 2021-06-13 MED ORDER — LIDOCAINE HCL (CARDIAC) PF 100 MG/5ML IV SOSY
PREFILLED_SYRINGE | INTRAVENOUS | Status: DC | PRN
  Administered 2021-06-13: 50 mg via INTRAVENOUS

## 2021-06-13 MED ORDER — SODIUM CHLORIDE 0.9 % IV SOLN: INTRAVENOUS | Status: DC

## 2021-06-13 MED ORDER — PROPOFOL 500 MG/50ML IV EMUL
INTRAVENOUS | Status: AC
  Filled 2021-06-13: qty 50

## 2021-06-13 MED ORDER — PROPOFOL 10 MG/ML IV BOLUS
INTRAVENOUS | Status: DC | PRN
  Administered 2021-06-13: 60 mg via INTRAVENOUS
  Administered 2021-06-13: 70 mg via INTRAVENOUS
  Administered 2021-06-13: 20 mg via INTRAVENOUS

## 2021-06-13 NOTE — Op Note (Signed)
[facility] ?Gastroenterology ?Patient Name: Kimberly Hancock ?Procedure Date: 06/13/2021 7:18 AM ?MRN: [id number] ?Account #: [id number] ?Date of Birth: 09/23/1965 ?Admit Type: Outpatient ?Age: 56 ?Room: [facility] ENDO ROOM 3 ?Gender: Female ?Note Status: Finalized ?Instrument Name: Upper-Endoscope [id number] ?Procedure:             Upper GI endoscopy ?Indications:           Capsule placement due to impaired swallowing ?Providers:             Jonathon Bellows MD, MD ?Referring MD:          Kirstie Peri. Caryn Section, MD (Referring MD) ?Medicines:             Monitored Anesthesia Care ?Complications:         No immediate complications. ?Procedure:             Pre-Anesthesia Assessment: ?                       - Prior to the procedure, a History and Physical was  ?                       performed, and patient medications, allergies and  ?                       sensitivities were reviewed. The patient's tolerance  ?                       of previous anesthesia was reviewed. ?                       - The risks and benefits of the procedure and the  ?                       sedation options and risks were discussed with the  ?                       patient. All questions were answered and informed  ?                       consent was obtained. ?                       - ASA Grade Assessment: II - A patient with mild  ?                       systemic disease. ?                       After obtaining informed consent, the endoscope was  ?                       passed under direct vision. Throughout the procedure,  ?                       the patient's blood pressure, pulse, and oxygen  ?                       saturations were monitored continuously. The Endoscope  ?  was introduced through the mouth, and advanced to the  ?                       third part of duodenum. The upper GI endoscopy was  ?                       accomplished with ease. The patient tolerated the  ?                       procedure  well. ?Findings: ?     The esophagus was normal. ?     The examined duodenum was normal. Using the endoscope, the video capsule  ?     enteroscope was advanced into the second portion of the duodenum. ?     [Site] was. ?     The cardia and gastric fundus were normal on retroflexion. ?Impression:            - Normal esophagus. ?                       - Normal examined duodenum. ?                       - Normal stomach. ?                       - Successful completion of the Video Capsule  ?                       Enteroscope placement. ?                       - No specimens collected. ?Recommendation:        - Discharge patient to home (with escort). ?                       - Resume previous diet. ?                       - Continue present medications. ?                       - Return to my office as previously scheduled. ?Procedure Code(s):     --- Professional --- ?                       [phone], Esophagogastroduodenoscopy, flexible,  ?                       transoral; diagnostic, including collection of  ?                       specimen(s) by brushing or washing, when performed  ?                       (separate procedure) ?Diagnosis Code(s):     --- Professional --- ?                       R13.10, Dysphagia, unspecified ?CPT copyright 2019 American Medical Association. All rights reserved. ?The codes documented in this report are preliminary and upon coder review may  ?be revised to meet current compliance requirements. ?  Jonathon Bellows, MD ?Jonathon Bellows MD, MD ?06/13/2021 8:02:47 AM ?This report has been signed electronically. ?Number of Addenda: 0 ?Note Initiated On: 06/13/2021 7:18 AM ?Estimated Blood Loss:  Estimated blood loss: none. ?     [facility] ?

## 2021-06-13 NOTE — Anesthesia Procedure Notes (Signed)
Procedure Name: [place] ?Date/Time: 06/13/2021 7:52 AM ?Performed by: Jerrye Noble, CRNA ?Pre-anesthesia Checklist: Patient identified, Emergency Drugs available, Suction available and Patient being monitored ?Patient Re-evaluated:Patient Re-evaluated prior to induction ?Oxygen Delivery Method: Nasal cannula ? ? ? ? ?

## 2021-06-13 NOTE — Anesthesia Postprocedure Evaluation (Signed)
Anesthesia Post Note ? ?Patient: Kimberly Hancock ? ?Procedure(s) Performed: ESOPHAGOGASTRODUODENOSCOPY (EGD) WITH PROPOFOL with CAPSULE PLACEMENT ?GIVENS CAPSULE STUDY ? ?Patient location during evaluation: Endoscopy ?Anesthesia Type: General ?Level of consciousness: awake and alert ?Pain management: pain level controlled ?Vital Signs Assessment: post-procedure vital signs reviewed and stable ?Respiratory status: spontaneous breathing, nonlabored ventilation, respiratory function stable and patient connected to nasal cannula oxygen ?Cardiovascular status: blood pressure returned to baseline and stable ?Postop Assessment: no apparent nausea or vomiting ?Anesthetic complications: no ? ? ?No notable events documented. ? ? ?Last Vitals:  ?Vitals:  ? 06/13/21 0806 06/13/21 0807  ?BP: 119/65   ?Pulse: 89   ?Resp:    ?Temp:  (!) 36.4 ?C  ?SpO2:    ?  ?Last Pain:  ?Vitals:  ? 06/13/21 0851  ?TempSrc:   ?PainSc: 0-No pain  ? ? ?  ?  ?  ?  ?  ?  ? ?[name] ? ? ? ? ?

## 2021-06-13 NOTE — Transfer of Care (Signed)
Immediate Anesthesia Transfer of Care Note ? ?Patient: Kimberly Hancock ? ?Procedure(s) Performed: ESOPHAGOGASTRODUODENOSCOPY (EGD) WITH PROPOFOL with CAPSULE PLACEMENT ?GIVENS CAPSULE STUDY ? ?Patient Location: PACU and Endoscopy Unit ? ?Anesthesia Type:General ? ?Level of Consciousness: drowsy and patient cooperative ? ?Airway & Oxygen Therapy: Patient Spontanous Breathing ? ?Post-op Assessment: Report given to RN and Post -op Vital signs reviewed and stable ? ?Post vital signs: Reviewed and stable ? ?Last Vitals:  ?Vitals Value Taken Time  ?BP 119/68   ?Temp    ?Pulse 89   ?Resp 21 06/13/21 0805  ?SpO2 99   ?Vitals shown include unvalidated device data. ? ?Last Pain:  ?Vitals:  ? 06/13/21 0710  ?TempSrc: Temporal  ?PainSc: 0-No pain  ?   ? ?  ? ?Complications: No notable events documented. ?

## 2021-06-13 NOTE — Anesthesia Preprocedure Evaluation (Signed)
Anesthesia Evaluation  ?Patient identified by MRN, date of birth, ID band ?Patient awake ? ? ? ?Reviewed: ?Allergy & Precautions, NPO status , Patient's Chart, lab work & pertinent test results ? ?History of Anesthesia Complications ?Negative for: history of anesthetic complications ? ?Airway ?Mallampati: III ? ?TM Distance: <3 FB ?Neck ROM: full ? ? ? Dental ? ?(+) Chipped ?  ?Pulmonary ?neg pulmonary ROS, neg shortness of breath,  ?  ?Pulmonary exam normal ? ? ? ? ? ? ? Cardiovascular ?Exercise Tolerance: Good ?(-) angina(-) Past MI negative cardio ROS ?Normal cardiovascular exam ? ? ?  ?Neuro/Psych ?negative neurological ROS ? negative psych ROS  ? GI/Hepatic ?negative GI ROS, Neg liver ROS, neg GERD  ,  ?Endo/Other  ?diabetes, Type 2Hypothyroidism  ? Renal/GU ?negative Renal ROS  ?negative genitourinary ?  ?Musculoskeletal ? ?(+) Arthritis ,  ? Abdominal ?  ?Peds ? Hematology ?negative hematology ROS ?(+)   ?Anesthesia Other Findings ?Past Medical History: ?No date: ANA positive ?No date: Anemia ?12/2018: COVID-19 ?No date: Diabetes mellitus without complication ([place]) ?No date: Hepatomegaly ?No date: History of chicken pox ?No date: Hypothyroidism ?1999: Infected cyst of skin ?No date: Osteoarthritis ?No date: Vitamin D deficiency ? ?Past Surgical History: ?07/03/2005: CESAREAN SECTION ?06/28/2015: COLONOSCOPY WITH PROPOFOL; N/A ?    Comment:  Procedure: COLONOSCOPY WITH PROPOFOL;  Surgeon: Dellis Filbert  ?             Amedeo Kinsman, MD;  Location: [facility] ENDOSCOPY;  Service:  ?             Endoscopy;  Laterality: N/A; ?03/13/2021: COLONOSCOPY WITH PROPOFOL; N/A ?    Comment:  Procedure: COLONOSCOPY WITH PROPOFOL;  Surgeon: Vicente Males,  ?             Bailey Mech, MD;  Location: [facility];  Service:  ?             Gastroenterology;  Laterality: N/A; ?03/13/2021: ESOPHAGOGASTRODUODENOSCOPY; N/A ?    Comment:  Procedure: ESOPHAGOGASTRODUODENOSCOPY (EGD);  Surgeon:  ?             Jonathon Bellows, MD;   Location: [facility] ENDOSCOPY;  Service:  ?             Gastroenterology;  Laterality: N/A; ?2003: LIPOMA EXCISION ?    Comment:  mid back/ Dr Bary Castilla ?12/28/1998: pap smear ?    Comment:  Normala gain in 2023 ?03/09/2012: ultrasound ?    Comment:  Hepayomegaly. Liver= 20.6cm ? ?BMI   ? Body Mass Index: 38.16 kg/m?  ?  ? ? Reproductive/Obstetrics ?negative OB ROS ? ?  ? ? ? ? ? ? ? ? ? ? ? ? ? ?  ?  ? ? ? ? ? ? ? ? ?Anesthesia Physical ?Anesthesia Plan ? ?ASA: 3 ? ?Anesthesia Plan: General  ? ?Post-op Pain Management:   ? ?Induction: Intravenous ? ?PONV Risk Score and Plan: Propofol infusion and TIVA ? ?Airway Management Planned: Natural Airway and Nasal Cannula ? ?Additional Equipment:  ? ?Intra-op Plan:  ? ?Post-operative Plan:  ? ?Informed Consent: I have reviewed the patients History and Physical, chart, labs and discussed the procedure including the risks, benefits and alternatives for the proposed anesthesia with the patient or authorized representative who has indicated his/her understanding and acceptance.  ? ? ? ?Dental Advisory Given ? ?Plan Discussed with: Anesthesiologist, CRNA and Surgeon ? ?Anesthesia Plan Comments: (Patient consented for risks of anesthesia including but not limited to:  ?- adverse reactions to medications ?-  risk of airway placement if required ?- damage to eyes, teeth, lips or other oral mucosa ?- nerve damage due to positioning  ?- sore throat or hoarseness ?- Damage to heart, brain, nerves, lungs, other parts of body or loss of life ? ?Patient voiced understanding.)  ? ? ? ? ? ? ?Anesthesia Quick Evaluation ? ?

## 2021-06-13 NOTE — H&P (Signed)
? ? ? ?Kimberly Bellows, MD ?[address], [place], [place], [place], [address] ?[address] [place], [place], [place], [address] ?Phone: [phone]  ?Fax: [phone] ? ?Primary Care Physician:  Birdie Sons, MD ? ? ?Pre-Procedure History & Physical: ?HPI:  Kimberly Hancock is a 56 y.o. female is here for an endoscopy  ?  ?Past Medical History:  ?Diagnosis Date  ? ANA positive   ? Anemia   ? COVID-19 12/2018  ? Diabetes mellitus without complication ([place])   ? Hepatomegaly   ? History of chicken pox   ? Hypothyroidism   ? Infected cyst of skin 1999  ? Osteoarthritis   ? Vitamin D deficiency   ? ? ?Past Surgical History:  ?Procedure Laterality Date  ? CESAREAN SECTION  07/03/2005  ? COLONOSCOPY WITH PROPOFOL N/A 06/28/2015  ? Procedure: COLONOSCOPY WITH PROPOFOL;  Surgeon: Robert Bellow, MD;  Location: [facility] ENDOSCOPY;  Service: Endoscopy;  Laterality: N/A;  ? COLONOSCOPY WITH PROPOFOL N/A 03/13/2021  ? Procedure: COLONOSCOPY WITH PROPOFOL;  Surgeon: Kimberly Bellows, MD;  Location: [facility] ENDOSCOPY;  Service: Gastroenterology;  Laterality: N/A;  ? ESOPHAGOGASTRODUODENOSCOPY N/A 03/13/2021  ? Procedure: ESOPHAGOGASTRODUODENOSCOPY (EGD);  Surgeon: Kimberly Bellows, MD;  Location: [facility] ENDOSCOPY;  Service: Gastroenterology;  Laterality: N/A;  ? LIPOMA EXCISION  2003  ? mid back/ Dr Bary Castilla  ? pap smear  12/28/1998  ? Normala gain in 2023  ? ultrasound  03/09/2012  ? Hepayomegaly. Liver= 20.6cm  ? ? ?Prior to Admission medications   ?Medication Sig Start Date End Date Taking? Authorizing Provider  ?cyanocobalamin (,VITAMIN B-12,) 1000 MCG/ML injection INJECT 1 ML INTO THE MUSCLE MONTHLY 04/05/20  Yes Birdie Sons, MD  ?fluconazole (DIFLUCAN) 200 MG tablet Take 200 mg by mouth once a week. 04/01/21  Yes [provider]  ?hydrochlorothiazide (HYDRODIURIL) 25 MG tablet TAKE 1 TABLET BY MOUTH EVERY DAY 03/28/21  Yes Birdie Sons, MD  ?metFORMIN (GLUCOPHAGE) 500 MG tablet TAKE 1 TABLET BY MOUTH THREE TIMES A DAY 03/28/21   Yes Birdie Sons, MD  ?pravastatin (PRAVACHOL) 20 MG tablet Take 1 tablet (20 mg total) by mouth daily. 07/26/20  Yes Birdie Sons, MD  ?Vitamin D, Ergocalciferol, (DRISDOL) 1.25 MG (50000 UNIT) CAPS capsule TAKE 1 CAPSULE BY MOUTH ONE TIME PER WEEK 04/04/21  Yes Birdie Sons, MD  ? ? ?Allergies as of 06/04/2021 - Review Complete 05/17/2021  ?Allergen Reaction Noted  ? Amoxicillin Itching 11/29/2013  ? Sulfa antibiotics Itching and Swelling 11/29/2013  ? ? ?Family History  ?Problem Relation Age of Onset  ? Diabetes Mother   ?     type 2  ? Cancer Brother 11  ?     bone passed age 81  ? Diabetes Father   ? Dementia Maternal Grandmother   ? Brain cancer Maternal Grandmother   ? Congestive Heart Failure Maternal Grandfather   ? Dementia Paternal Grandmother   ? Stroke Paternal Grandfather   ? Breast cancer Neg Hx   ? ? ?Social History  ? ?Socioeconomic History  ? Marital status: Married  ?  Spouse name: Not on file  ? Number of children: 3  ? Years of education: Not on file  ? Highest education level: Not on file  ?Occupational History  ? Occupation: Web designer  ?  Comment: works for [employer]  ?Tobacco Use  ? Smoking status: Never  ? Smokeless tobacco: Never  ?Vaping Use  ? Vaping Use: Never used  ?Substance and  Sexual Activity  ? Alcohol use: No  ?  Alcohol/week: 0.0 standard drinks  ? Drug use: No  ? Sexual activity: Yes  ?Other Topics Concern  ? Not on file  ?Social History Narrative  ? Not on file  ? ?Social Determinants of Health  ? ?Financial Resource Strain: Not on file  ?Food Insecurity: Not on file  ?Transportation Needs: Not on file  ?Physical Activity: Not on file  ?Stress: Not on file  ?Social Connections: Not on file  ?Intimate Partner Violence: Not on file  ? ? ?Review of Systems: ?See HPI, otherwise negative ROS ? ?Physical Exam: ?BP 132/76   Pulse 92   Temp (!) 97.3 ?F (36.3 ?C) (Temporal)   Resp 18   Ht 5' (1.524 m)   Wt 88.6 kg   SpO2 100%   BMI 38.16 kg/m?  ?General:    Alert,  pleasant and cooperative in NAD ?Head:  Normocephalic and atraumatic. ?Neck:  Supple; no masses or thyromegaly. ?Lungs:  Clear throughout to auscultation, normal respiratory effort.    ?Heart:  +S1, +S2, Regular rate and rhythm, No edema. ?Abdomen:  Soft, nontender and nondistended. Normal bowel sounds, without guarding, and without rebound.   ?Neurologic:  Alert and  oriented x4;  grossly normal neurologically. ? ?Impression/Plan: ?[name] is here for an endoscopy  to be performed for  placement of capsule. ?   ?Risks, benefits, limitations, and alternatives regarding endoscopy have been reviewed with the patient.  Questions have been answered.  All parties agreeable. ? ? ?Kimberly Bellows, MD  06/13/2021, 7:46 AM ? ?

## 2021-06-13 NOTE — Progress Notes (Signed)
Patient has called and said she wants the IV iron and it has been scheduled. ?

## 2021-06-15 ENCOUNTER — Encounter: Payer: Self-pay | Admitting: Gastroenterology

## 2021-06-18 ENCOUNTER — Inpatient Hospital Stay: Payer: BC Managed Care – PPO

## 2021-06-18 VITALS — BP 121/68 | HR 79 | Temp 97.2°F | Resp 17

## 2021-06-18 DIAGNOSIS — D508 Other iron deficiency anemias: Secondary | ICD-10-CM

## 2021-06-18 DIAGNOSIS — D509 Iron deficiency anemia, unspecified: Secondary | ICD-10-CM | POA: Diagnosis not present

## 2021-06-18 MED ORDER — SODIUM CHLORIDE 0.9 % IV SOLN
Freq: Once | INTRAVENOUS | Status: AC
  Filled 2021-06-18: qty 250

## 2021-06-18 MED ORDER — IRON SUCROSE 20 MG/ML IV SOLN
200.0000 mg | Freq: Once | INTRAVENOUS | Status: AC
  Administered 2021-06-18: 200 mg via INTRAVENOUS
  Filled 2021-06-18: qty 10

## 2021-06-18 MED ORDER — SODIUM CHLORIDE 0.9 % IV SOLN: 200.0000 mg | INTRAVENOUS | Status: DC

## 2021-06-18 NOTE — Progress Notes (Signed)
Patient tolerated initial Venofer infusion well, no questions/concerns voiced. Patient monitored 30 min post transfusion, stable at discharge. AVS given.   ?

## 2021-06-18 NOTE — Patient Instructions (Signed)

## 2021-06-19 ENCOUNTER — Telehealth: Payer: Self-pay | Admitting: Gastroenterology

## 2021-06-19 NOTE — Telephone Encounter (Signed)
Patient calling requesting results from recent EGD. Requesting a call back. ?

## 2021-06-20 NOTE — Telephone Encounter (Signed)
Capsule study is normal

## 2021-06-20 NOTE — Telephone Encounter (Signed)
Called patient back to let her know that her capsule study was normal. Patient was glad to hear. ?

## 2021-06-20 NOTE — Telephone Encounter (Signed)
Dr. Vicente Males, can you please let me know what the results of the patient's capsule study so I could call her back. Thank you. ?

## 2021-06-21 ENCOUNTER — Inpatient Hospital Stay: Payer: BC Managed Care – PPO

## 2021-06-21 VITALS — BP 115/61 | HR 78 | Temp 98.3°F | Resp 16

## 2021-06-21 DIAGNOSIS — D509 Iron deficiency anemia, unspecified: Secondary | ICD-10-CM | POA: Diagnosis not present

## 2021-06-21 DIAGNOSIS — D508 Other iron deficiency anemias: Secondary | ICD-10-CM

## 2021-06-21 MED ORDER — IRON SUCROSE 20 MG/ML IV SOLN
200.0000 mg | Freq: Once | INTRAVENOUS | Status: AC
  Administered 2021-06-21: 200 mg via INTRAVENOUS
  Filled 2021-06-21: qty 10

## 2021-06-21 MED ORDER — SODIUM CHLORIDE 0.9 % IV SOLN
Freq: Once | INTRAVENOUS | Status: AC
  Filled 2021-06-21: qty 250

## 2021-06-21 MED ORDER — SODIUM CHLORIDE 0.9 % IV SOLN: 200.0000 mg | INTRAVENOUS | Status: DC

## 2021-06-21 NOTE — Patient Instructions (Signed)

## 2021-06-26 ENCOUNTER — Inpatient Hospital Stay: Payer: BC Managed Care – PPO | Attending: Oncology

## 2021-06-26 ENCOUNTER — Encounter: Payer: Self-pay | Admitting: Gastroenterology

## 2021-06-26 VITALS — BP 121/68 | HR 75 | Temp 98.7°F | Resp 20

## 2021-06-26 DIAGNOSIS — D508 Other iron deficiency anemias: Secondary | ICD-10-CM

## 2021-06-26 DIAGNOSIS — D509 Iron deficiency anemia, unspecified: Secondary | ICD-10-CM | POA: Insufficient documentation

## 2021-06-26 DIAGNOSIS — Z79899 Other long term (current) drug therapy: Secondary | ICD-10-CM | POA: Insufficient documentation

## 2021-06-26 MED ORDER — SODIUM CHLORIDE 0.9 % IV SOLN
Freq: Once | INTRAVENOUS | Status: AC
  Filled 2021-06-26: qty 250

## 2021-06-26 MED ORDER — IRON SUCROSE 20 MG/ML IV SOLN
200.0000 mg | Freq: Once | INTRAVENOUS | Status: AC
  Administered 2021-06-26: 200 mg via INTRAVENOUS
  Filled 2021-06-26: qty 10

## 2021-06-26 MED ORDER — SODIUM CHLORIDE 0.9 % IV SOLN: 200.0000 mg | INTRAVENOUS | Status: DC

## 2021-06-28 ENCOUNTER — Inpatient Hospital Stay: Payer: BC Managed Care – PPO

## 2021-06-28 VITALS — BP 118/66 | HR 82 | Temp 96.9°F | Resp 20

## 2021-06-28 DIAGNOSIS — D509 Iron deficiency anemia, unspecified: Secondary | ICD-10-CM | POA: Diagnosis not present

## 2021-06-28 DIAGNOSIS — D508 Other iron deficiency anemias: Secondary | ICD-10-CM

## 2021-06-28 MED ORDER — IRON SUCROSE 20 MG/ML IV SOLN
200.0000 mg | Freq: Once | INTRAVENOUS | Status: AC
  Administered 2021-06-28: 200 mg via INTRAVENOUS
  Filled 2021-06-28: qty 10

## 2021-06-28 MED ORDER — SODIUM CHLORIDE 0.9 % IV SOLN: 200.0000 mg | INTRAVENOUS | Status: DC

## 2021-06-28 MED ORDER — SODIUM CHLORIDE 0.9 % IV SOLN
Freq: Once | INTRAVENOUS | Status: AC
  Filled 2021-06-28: qty 250

## 2021-06-28 NOTE — Patient Instructions (Signed)

## 2021-06-28 NOTE — Progress Notes (Signed)
Pt tolerated venofer infusion well today with no problems or complaints. Pt left infusion suite stable and ambulatory.  

## 2021-07-04 ENCOUNTER — Other Ambulatory Visit: Payer: Self-pay | Admitting: Family Medicine

## 2021-07-04 DIAGNOSIS — E119 Type 2 diabetes mellitus without complications: Secondary | ICD-10-CM

## 2021-07-05 ENCOUNTER — Inpatient Hospital Stay: Payer: BC Managed Care – PPO

## 2021-07-05 VITALS — BP 99/58 | HR 79 | Temp 97.7°F | Resp 20

## 2021-07-05 DIAGNOSIS — D508 Other iron deficiency anemias: Secondary | ICD-10-CM

## 2021-07-05 DIAGNOSIS — D509 Iron deficiency anemia, unspecified: Secondary | ICD-10-CM | POA: Diagnosis not present

## 2021-07-05 MED ORDER — IRON SUCROSE 20 MG/ML IV SOLN
200.0000 mg | Freq: Once | INTRAVENOUS | Status: AC
  Administered 2021-07-05: 200 mg via INTRAVENOUS
  Filled 2021-07-05: qty 10

## 2021-07-05 MED ORDER — SODIUM CHLORIDE 0.9 % IV SOLN
Freq: Once | INTRAVENOUS | Status: AC
  Filled 2021-07-05: qty 250

## 2021-07-05 MED ORDER — SODIUM CHLORIDE 0.9 % IV SOLN: 200.0000 mg | INTRAVENOUS | Status: DC

## 2021-07-05 NOTE — Patient Instructions (Signed)

## 2021-07-05 NOTE — Progress Notes (Signed)
Pt tolerate venofer infusion well with no problems or complaints.  Pt left infusion suite stable and ambulatory.  ?

## 2021-08-17 ENCOUNTER — Encounter: Payer: Self-pay | Admitting: Oncology

## 2021-08-17 LAB — RESULTS CONSOLE HPV: CHL HPV: NEGATIVE

## 2021-08-17 LAB — HM PAP SMEAR: HM Pap smear: NEGATIVE

## 2021-08-20 ENCOUNTER — Ambulatory Visit: Payer: BC Managed Care – PPO | Admitting: Gastroenterology

## 2021-08-21 ENCOUNTER — Other Ambulatory Visit: Payer: Self-pay

## 2021-08-21 DIAGNOSIS — Z1231 Encounter for screening mammogram for malignant neoplasm of breast: Secondary | ICD-10-CM

## 2021-09-03 ENCOUNTER — Inpatient Hospital Stay: Payer: BC Managed Care – PPO | Attending: Oncology

## 2021-09-03 DIAGNOSIS — D509 Iron deficiency anemia, unspecified: Secondary | ICD-10-CM | POA: Diagnosis present

## 2021-09-03 LAB — CBC
HCT: 42 % (ref 36.0–46.0)
Hemoglobin: 13.8 g/dL (ref 12.0–15.0)
MCH: 27.7 pg (ref 26.0–34.0)
MCHC: 32.9 g/dL (ref 30.0–36.0)
MCV: 84.3 fL (ref 80.0–100.0)
Platelets: 239 10*3/uL (ref 150–400)
RBC: 4.98 MIL/uL (ref 3.87–5.11)
RDW: 17.2 % — ABNORMAL HIGH (ref 11.5–15.5)
WBC: 7.3 10*3/uL (ref 4.0–10.5)
nRBC: 0 % (ref 0.0–0.2)

## 2021-09-03 LAB — IRON AND TIBC
Iron: 55 ug/dL (ref 28–170)
Saturation Ratios: 13 % (ref 10.4–31.8)
TIBC: 420 ug/dL (ref 250–450)
UIBC: 365 ug/dL

## 2021-09-03 LAB — FERRITIN: Ferritin: 60 ng/mL (ref 11–307)

## 2021-09-11 ENCOUNTER — Ambulatory Visit (INDEPENDENT_AMBULATORY_CARE_PROVIDER_SITE_OTHER): Payer: BC Managed Care – PPO | Admitting: Gastroenterology

## 2021-09-11 VITALS — BP 124/82 | HR 76 | Temp 98.8°F | Wt 196.0 lb

## 2021-09-11 DIAGNOSIS — D509 Iron deficiency anemia, unspecified: Secondary | ICD-10-CM

## 2021-09-11 NOTE — Progress Notes (Signed)
Jonathon Bellows MD, MRCP(U.K) [address]  [place]  [place], [place] [address]  Main: [phone]  Fax: [phone]   Primary Care Physician: Birdie Sons, MD  Primary Gastroenterologist:  Dr. Jonathon Bellows   Chief Complaint  Patient presents with   Follow-up    HPI: Kimberly Hancock is a 56 y.o. female Summary of history :   She is a patient was seen previously by Dr. Bonna Gains back in December 2022 when she was referred for a FOBT positive stool.  This was done because the patient had microcytic anemia. Subsequently had a ferritin of 18 and iron saturation of less than 5.  TIBC was elevated.  Suggesting iron deficiency. 03/13/2021: EGD: 12 mm polyp was seen in the greater curvature the stomach resected via EMR.  I also performed a colonoscopy on the same day and 2 polyps 3 and 5 mm in size were resected from the ascending and transverse colon respectively.  No abnormalities were seen.  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   11/13/2020 hemoglobin 10.8 g and MCV of 75   Interval history   05/17/2021-09/11/2021  05/17/2021 ferritin of 12, urine analysis showed no blood in the urine B12 levels borderline low recommended IV iron, celiac serology was negative. 06/13/2021: EGD: Normal study VCE was placed in the duodenum. 421 2023 capsule study of small bowel normal study  09/03/2021 hemoglobin 13.8 g, ferritin 60 and iron studies normal.  Doing well no complaints not using any NSAIDs.  Completed a course of IV iron  Current Outpatient Medications  Medication Sig Dispense Refill   cyanocobalamin (,VITAMIN B-12,) 1000 MCG/ML injection INJECT 1 ML INTO THE MUSCLE MONTHLY 3 mL 3   fluconazole (DIFLUCAN) 200 MG tablet Take 200 mg by mouth once a week.     hydrochlorothiazide (HYDRODIURIL) 25 MG tablet TAKE 1 TABLET BY MOUTH EVERY DAY 90 tablet 4   metFORMIN (GLUCOPHAGE) 500 MG tablet TAKE 1 TABLET BY MOUTH THREE TIMES A DAY  270 tablet 3   pravastatin (PRAVACHOL) 20 MG tablet TAKE 1 TABLET BY MOUTH EVERY DAY 90 tablet 4   Vitamin D, Ergocalciferol, (DRISDOL) 1.25 MG (50000 UNIT) CAPS capsule TAKE 1 CAPSULE BY MOUTH ONE TIME PER WEEK 12 capsule 4   No current facility-administered medications for this visit.    Allergies as of 09/11/2021 - Review Complete 09/11/2021  Allergen Reaction Noted   Amoxicillin Itching 11/29/2013   Sulfa antibiotics Itching and Swelling 11/29/2013    ROS:  General: Negative for anorexia, weight loss, fever, chills, fatigue, weakness. ENT: Negative for hoarseness, difficulty swallowing , nasal congestion. CV: Negative for chest pain, angina, palpitations, dyspnea on exertion, peripheral edema.  Respiratory: Negative for dyspnea at rest, dyspnea on exertion, cough, sputum, wheezing.  GI: See history of present illness. GU:  Negative for dysuria, hematuria, urinary incontinence, urinary frequency, nocturnal urination.  Endo: Negative for unusual weight change.    Physical Examination:   LMP 06/07/2017   General: Well-nourished, well-developed in no acute distress.  Eyes: No icterus. Conjunctivae pink. Mouth: Oropharyngeal mucosa moist and pink , no lesions erythema or exudate. Neuro: Alert and oriented x 3.  Grossly intact. Skin: Warm and dry, no jaundice.   Psych: Alert and cooperative, normal mood and affect.   Imaging Studies: No results found.  Assessment and Plan:   Kimberly Hancock is a 56 y.o. y/o female here today to see me for iron deficiency anemia.  EGD and colonoscopy showed a tubular adenoma which was 5 mm in size and a gastric polyp that was hyperplastic that was resected.  No other abnormalities were seen to explain the iron deficiency anemia.  It is very likely that her anemia is from chronic NSAID use which she has stopped.  In July 2023 hemoglobin had normalized with normalization of iron studies.  B12 levels were borderline low of 290 recommend being checked  with her primary care physician at some point in the future.  Dr Jonathon Bellows  MD,MRCP [facility]) Follow up in as needed

## 2021-09-12 ENCOUNTER — Ambulatory Visit
Admission: RE | Admit: 2021-09-12 | Discharge: 2021-09-12 | Disposition: A | Discharge status: Home / Self Care | Payer: BC Managed Care – PPO | Source: Ambulatory Visit

## 2021-09-12 DIAGNOSIS — Z1231 Encounter for screening mammogram for malignant neoplasm of breast: Secondary | ICD-10-CM | POA: Diagnosis present

## 2021-09-25 ENCOUNTER — Other Ambulatory Visit: Payer: Self-pay | Admitting: Physician Assistant

## 2021-09-25 ENCOUNTER — Encounter: Payer: Self-pay | Admitting: Physician Assistant

## 2021-09-25 DIAGNOSIS — M1712 Unilateral primary osteoarthritis, left knee: Secondary | ICD-10-CM

## 2021-09-25 DIAGNOSIS — M2550 Pain in unspecified joint: Secondary | ICD-10-CM

## 2021-10-03 ENCOUNTER — Encounter: Payer: Self-pay | Admitting: Family Medicine

## 2021-10-03 DIAGNOSIS — E538 Deficiency of other specified B group vitamins: Secondary | ICD-10-CM

## 2021-10-03 MED ORDER — CYANOCOBALAMIN 1000 MCG/ML IJ SOLN: INTRAMUSCULAR | 3 refills | Status: DC

## 2021-10-04 ENCOUNTER — Ambulatory Visit: Payer: Self-pay

## 2021-10-04 NOTE — Telephone Encounter (Signed)
  Chief Complaint: med refill Symptoms: NA Frequency: NA Pertinent Negatives: NA Disposition: '[]'$ ED /'[]'$ Urgent Care (no appt availability in office) / '[]'$ Appointment(In office/virtual)/ '[]'$  [facility]/ '[x]'$ Home Care/ '[]'$ Refused Recommended Disposition /'[]'$ [facility]/ '[]'$  Follow-up with PCP Additional Notes: advised pt that rx was sent to pharmacy yesterday. Pt states she will reach out to pharmacy.   Summary: Refill request made online checking status   Patient called in regarding a request for a refill she made through my chart. She is checking on the status of it and wants to know if the provider will sign off on it. Please assist patient further      Reason for Disposition  Caller has medicine question only, adult not sick, AND triager answers question  Answer Assessment - Initial Assessment Questions 1. NAME of MEDICINE: "What medicine(s) are you calling about?"     Vit B12 injections 2. QUESTION: "What is your question?" (e.g., double dose of medicine, side effect)     Sent refill request, checking status 3. PRESCRIBER: "Who prescribed the medicine?" Reason: if prescribed by specialist, call should be referred to that group.     Dr. Caryn Section  Protocols used: Medication Question Call-A-AH

## 2021-10-05 ENCOUNTER — Telehealth: Payer: Self-pay

## 2021-10-05 MED ORDER — "INSULIN SYRINGE-NEEDLE U-100 25G X 1"" 1 ML MISC": 1 refills | Status: AC

## 2021-10-05 NOTE — Telephone Encounter (Signed)
Patient request refill

## 2021-11-01 LAB — VITAMIN D 25 HYDROXY (VIT D DEFICIENCY, FRACTURES): Vit D, 25-Hydroxy: 53.6

## 2021-11-01 LAB — VITAMIN B12: Vitamin B-12: 182

## 2021-11-01 LAB — TSH: TSH: 2.24 (ref 0.41–5.90)

## 2021-11-15 NOTE — Progress Notes (Signed)
Argentina Ponder [place],acting as a scribe for Lelon Huh, MD.,have documented all relevant documentation on the behalf of Lelon Huh, MD,as directed by  Lelon Huh, MD while in the presence of Lelon Huh, MD.    Established patient visit   Patient: Kimberly Hancock   DOB: 06-05-1965   56 y.o. Female  MRN: [id number] Visit Date: 11/16/2021  Today's healthcare provider: Lelon Huh, MD   Chief Complaint  Patient presents with   Diabetes   Hypertension   B12 Deficiency   Subjective    HPI  Diabetes Mellitus Type II, Follow-up  Lab Results  Component Value Date   HGBA1C 6.3 (A) 05/15/2021   HGBA1C 6.3 (A) 11/13/2020   HGBA1C 6.6 (A) 06/20/2020   Wt Readings from Last 3 Encounters:  11/16/21 193 lb (87.5 kg)  09/11/21 196 lb (88.9 kg)  06/13/21 195 lb 6.3 oz (88.6 kg)   Last seen for diabetes 6 months ago.  Management since then includes no changes. She reports good compliance with treatment. She is not having side effects.  Symptoms: No fatigue No foot ulcerations  No appetite changes No nausea  No paresthesia of the feet  No polydipsia  No polyuria No visual disturbances   No vomiting     Home blood sugar records:  not being checked  Episodes of hypoglycemia? No    Current insulin regiment: N/A Most Recent Eye Exam: Was seen by Dr Gloriann Loan in June Current exercise: Trying to exercise Current diet habits: tries to eat healthy  Pertinent Labs: Lab Results  Component Value Date   CHOL 169 06/20/2020   HDL 56 06/20/2020   LDLCALC 95 06/20/2020   TRIG 99 06/20/2020   CHOLHDL 3.0 06/20/2020   Lab Results  Component Value Date   NA 141 06/20/2020   K 4.0 06/20/2020   CREATININE 0.65 06/20/2020   EGFR 104 06/20/2020   MICROALBUR negative 11/13/2020   LABMICR <3.0 05/15/2021     --------------------------------------------------------------------------------------------------- Vitamin B12 Deficiency  Patient states she see rheumatology and they  recently checked her B12 level.  They were concerned at it being low and recommended she take B12 injections.  Patient states she takes B12 injections once a month and was due for monthly injection on day blood was drawn, she denies feeling unusually fatigued.   She is also due to follow up hypertension and hyperlipidemia. Is doing well on current medications. Tries to eat health.   Medications: Outpatient Medications Prior to Visit  Medication Sig   cyanocobalamin (VITAMIN B12) 1000 MCG/ML injection Inject 1 ml intramuscularly once every month   hydrochlorothiazide (HYDRODIURIL) 25 MG tablet TAKE 1 TABLET BY MOUTH EVERY DAY   Insulin Syringe-Needle U-100 25G X 1" 1 ML MISC Use monthly for vitamin B12 injections   metFORMIN (GLUCOPHAGE) 500 MG tablet TAKE 1 TABLET BY MOUTH THREE TIMES A DAY   pravastatin (PRAVACHOL) 20 MG tablet TAKE 1 TABLET BY MOUTH EVERY DAY   Vitamin D, Ergocalciferol, (DRISDOL) 1.25 MG (50000 UNIT) CAPS capsule TAKE 1 CAPSULE BY MOUTH ONE TIME PER WEEK   fluconazole (DIFLUCAN) 200 MG tablet Take 200 mg by mouth once a week.   No facility-administered medications prior to visit.      Last thyroid functions Lab Results  Component Value Date   TSH 2.24 11/01/2021   T4TOTAL 10.4 06/15/2015   Last vitamin D Lab Results  Component Value Date   VD25OH 53.6 11/01/2021   Last vitamin B12 and Folate Lab Results  Component  Value Date   VITAMINB12 182 11/01/2021   FOLATE 10.4 05/17/2021       Objective    BP 133/77 (BP Location: Left Arm, Patient Position: Sitting, Cuff Size: Normal)   Pulse 78   Temp 97.8 F (36.6 C) (Oral)   Resp 16   Wt 193 lb (87.5 kg)   LMP 06/07/2017   BMI 37.69 kg/m    Physical Exam   General: Appearance:    Mildly obese female in no acute distress  Eyes:    PERRL, conjunctiva/corneas clear, EOM's intact       Lungs:     Clear to auscultation bilaterally, respirations unlabored  Heart:    Normal heart rate. Normal rhythm. No  murmurs, rubs, or gallops.    MS:   All extremities are intact.    Neurologic:   Awake, alert, oriented x 3. No apparent focal neurological defect.         Assessment & Plan     1. Type 2 diabetes mellitus without complication, without long-term current use of insulin (HCC)  - CBC - Comprehensive metabolic panel - Hemoglobin A1c  2. Hyperlipidemia, unspecified hyperlipidemia type She is tolerating pravastatin well with no adverse effects.   - Lipid Panel With LDL/HDL Ratio - Lipid panel  3. B12 deficiency Is no midway between b12 injections - Vitamin B12  4. Need for influenza vaccination  - Flu Vaccine QUAD 6+ mos PF IM (Fluarix Quad PF)     The entirety of the information documented in the History of Present Illness, Review of Systems and Physical Exam were personally obtained by me. Portions of this information were initially documented by the CMA and reviewed by me for thoroughness and accuracy.     Lelon Huh, MD  [facility] [phone] (phone) [phone] (fax)  [place]

## 2021-11-16 ENCOUNTER — Ambulatory Visit (INDEPENDENT_AMBULATORY_CARE_PROVIDER_SITE_OTHER): Payer: BC Managed Care – PPO | Admitting: Family Medicine

## 2021-11-16 VITALS — BP 133/77 | HR 78 | Temp 97.8°F | Resp 16 | Wt 193.0 lb

## 2021-11-16 DIAGNOSIS — E538 Deficiency of other specified B group vitamins: Secondary | ICD-10-CM

## 2021-11-16 DIAGNOSIS — E785 Hyperlipidemia, unspecified: Secondary | ICD-10-CM | POA: Diagnosis not present

## 2021-11-16 DIAGNOSIS — Z23 Encounter for immunization: Secondary | ICD-10-CM

## 2021-11-16 DIAGNOSIS — E119 Type 2 diabetes mellitus without complications: Secondary | ICD-10-CM | POA: Diagnosis not present

## 2021-11-24 LAB — CBC
Hematocrit: 42 % (ref 34.0–46.6)
Hemoglobin: 14.4 g/dL (ref 11.1–15.9)
MCH: 29.9 pg (ref 26.6–33.0)
MCHC: 34.3 g/dL (ref 31.5–35.7)
MCV: 87 fL (ref 79–97)
Platelets: 239 10*3/uL (ref 150–450)
RBC: 4.81 x10E6/uL (ref 3.77–5.28)
RDW: 13 % (ref 11.7–15.4)
WBC: 6.5 10*3/uL (ref 3.4–10.8)

## 2021-11-24 LAB — COMPREHENSIVE METABOLIC PANEL
ALT: 138 IU/L — ABNORMAL HIGH (ref 0–32)
AST: 76 IU/L — ABNORMAL HIGH (ref 0–40)
Albumin/Globulin Ratio: 1.8 (ref 1.2–2.2)
Albumin: 4.8 g/dL (ref 3.8–4.9)
Alkaline Phosphatase: 92 IU/L (ref 44–121)
BUN/Creatinine Ratio: 21 (ref 9–23)
BUN: 14 mg/dL (ref 6–24)
Bilirubin Total: 0.5 mg/dL (ref 0.0–1.2)
CO2: 27 mmol/L (ref 20–29)
Calcium: 10 mg/dL (ref 8.7–10.2)
Chloride: 96 mmol/L (ref 96–106)
Creatinine, Ser: 0.67 mg/dL (ref 0.57–1.00)
Globulin, Total: 2.6 g/dL (ref 1.5–4.5)
Glucose: 108 mg/dL — ABNORMAL HIGH (ref 70–99)
Potassium: 4.4 mmol/L (ref 3.5–5.2)
Sodium: 142 mmol/L (ref 134–144)
Total Protein: 7.4 g/dL (ref 6.0–8.5)
eGFR: 103 mL/min/{1.73_m2} (ref >59)

## 2021-11-24 LAB — HEMOGLOBIN A1C
Est. average glucose Bld gHb Est-mCnc: 131 mg/dL
Hgb A1c MFr Bld: 6.2 % — ABNORMAL HIGH (ref 4.8–5.6)

## 2021-11-24 LAB — LIPID PANEL
Chol/HDL Ratio: 2.8 ratio (ref 0.0–4.4)
Cholesterol, Total: 157 mg/dL (ref 100–199)
HDL: 56 mg/dL (ref >39)
LDL Chol Calc (NIH): 86 mg/dL (ref 0–99)
Triglycerides: 81 mg/dL (ref 0–149)
VLDL Cholesterol Cal: 15 mg/dL (ref 5–40)

## 2021-11-24 LAB — VITAMIN B12: Vitamin B-12: 414 pg/mL (ref 232–1245)

## 2021-11-30 ENCOUNTER — Encounter: Payer: Self-pay | Admitting: Oncology

## 2021-12-03 NOTE — Telephone Encounter (Signed)
Ok to hold off on reepat labs

## 2021-12-04 ENCOUNTER — Other Ambulatory Visit: Payer: BC Managed Care – PPO

## 2021-12-04 ENCOUNTER — Encounter: Payer: Self-pay | Admitting: Oncology

## 2021-12-04 ENCOUNTER — Inpatient Hospital Stay: Payer: BC Managed Care – PPO | Attending: Oncology | Admitting: Oncology

## 2021-12-04 VITALS — BP 133/86 | HR 85 | Temp 98.6°F | Ht 59.5 in | Wt 193.7 lb

## 2021-12-04 DIAGNOSIS — Z86010 Personal history of colonic polyps: Secondary | ICD-10-CM | POA: Insufficient documentation

## 2021-12-04 DIAGNOSIS — I10 Essential (primary) hypertension: Secondary | ICD-10-CM | POA: Diagnosis not present

## 2021-12-04 DIAGNOSIS — E559 Vitamin D deficiency, unspecified: Secondary | ICD-10-CM | POA: Insufficient documentation

## 2021-12-04 DIAGNOSIS — E785 Hyperlipidemia, unspecified: Secondary | ICD-10-CM | POA: Insufficient documentation

## 2021-12-04 DIAGNOSIS — E039 Hypothyroidism, unspecified: Secondary | ICD-10-CM | POA: Insufficient documentation

## 2021-12-04 DIAGNOSIS — Z8616 Personal history of COVID-19: Secondary | ICD-10-CM | POA: Insufficient documentation

## 2021-12-04 DIAGNOSIS — E119 Type 2 diabetes mellitus without complications: Secondary | ICD-10-CM | POA: Insufficient documentation

## 2021-12-04 DIAGNOSIS — Z79899 Other long term (current) drug therapy: Secondary | ICD-10-CM | POA: Diagnosis not present

## 2021-12-04 DIAGNOSIS — D509 Iron deficiency anemia, unspecified: Secondary | ICD-10-CM | POA: Diagnosis present

## 2021-12-04 DIAGNOSIS — M1990 Unspecified osteoarthritis, unspecified site: Secondary | ICD-10-CM | POA: Diagnosis not present

## 2021-12-04 DIAGNOSIS — Z7984 Long term (current) use of oral hypoglycemic drugs: Secondary | ICD-10-CM | POA: Diagnosis not present

## 2021-12-04 NOTE — Progress Notes (Signed)
Hematology/Oncology Consult note [facility]  Telephone:([phone] [phone]  Patient Care Team: Birdie Sons, MD as PCP - General (Family Medicine) Bary Castilla, Forest Gleason, MD (General Surgery) Emmaline Kluver., MD (Rheumatology) Mosetta Anis, MD as Referring Physician (Allergy) Barrie Dunker, MD (Dermatology) Lorelee Cover., MD (Ophthalmology) Benjaman Kindler, MD as Consulting Physician (Obstetrics and Gynecology)   Name of the patient: Kimberly Hancock  [id number]  10/27/1965   Date of visit: 12/04/21  Diagnosis-iron deficiency anemia  Chief complaint/ Reason for visit-routine follow-up of iron deficiency anemia  Heme/Onc history: patient is a 56 year old female with a past medical history significant for hypertension hyperlipidemia type 2 diabetes who has been referred forIron deficiency anemia.  She was seen by Dr. Vicente Males from GI in March 2023 she has had EGD and colonoscopy in the past which showed a tubular adenoma 5 mm in size and a hyperplastic gastric polyp..  She has not undergone capsule study in the past because of inability to swallow the capsule pill.  Further GI work-up for iron deficiency anemia by GI showed negative H. pylori breath testing, negative celiac disease panel.  Iron studies showed a low ferritin of 12 with iron saturation of 5% and elevated TIBC of 477.  Last CBC was from September 2022 when it was 10.8 with an MCV of 75.  B12 levels were mildly low at 290 and folate level normal at 10.4.    Interval history-she has some baseline fatigue and reports no significant improvement in her symptoms despite receiving IV iron in May 2023.  Denies any blood loss in her stool or urine.  ECOG PS- 1 Pain scale- 0   Review of systems- Review of Systems  Constitutional:  Negative for chills, fever, malaise/fatigue and weight loss.  HENT:  Negative for congestion, ear discharge and nosebleeds.   Eyes:  Negative for  blurred vision.  Respiratory:  Negative for cough, hemoptysis, sputum production, shortness of breath and wheezing.   Cardiovascular:  Negative for chest pain, palpitations, orthopnea and claudication.  Gastrointestinal:  Negative for abdominal pain, blood in stool, constipation, diarrhea, heartburn, melena, nausea and vomiting.  Genitourinary:  Negative for dysuria, flank pain, frequency, hematuria and urgency.  Musculoskeletal:  Negative for back pain, joint pain and myalgias.  Skin:  Negative for rash.  Neurological:  Negative for dizziness, tingling, focal weakness, seizures, weakness and headaches.  Endo/Heme/Allergies:  Does not bruise/bleed easily.  Psychiatric/Behavioral:  Negative for depression and suicidal ideas. The patient does not have insomnia.       Allergies  Allergen Reactions   Misc. Sulfonamide Containing Compounds Itching   Amoxicillin Itching   Sulfa Antibiotics Itching and Swelling     Past Medical History:  Diagnosis Date   ANA positive    Anemia    COVID-19 12/2018   Diabetes mellitus without complication ([place])    Hepatomegaly    History of chicken pox    Hypothyroidism    Infected cyst of skin 1999   Osteoarthritis    Vitamin D deficiency      Past Surgical History:  Procedure Laterality Date   CESAREAN SECTION  07/03/2005   COLONOSCOPY WITH PROPOFOL N/A 06/28/2015   Procedure: COLONOSCOPY WITH PROPOFOL;  Surgeon: Robert Bellow, MD;  Location: [facility] ENDOSCOPY;  Service: Endoscopy;  Laterality: N/A;   COLONOSCOPY WITH PROPOFOL N/A 03/13/2021   Procedure: COLONOSCOPY WITH PROPOFOL;  Surgeon: Jonathon Bellows, MD;  Location: [facility] ENDOSCOPY;  Service: Gastroenterology;  Laterality: N/A;  ESOPHAGOGASTRODUODENOSCOPY N/A 03/13/2021   Procedure: ESOPHAGOGASTRODUODENOSCOPY (EGD);  Surgeon: Jonathon Bellows, MD;  Location: [facility] ENDOSCOPY;  Service: Gastroenterology;  Laterality: N/A;   ESOPHAGOGASTRODUODENOSCOPY (EGD) WITH PROPOFOL N/A 06/13/2021   Procedure:  ESOPHAGOGASTRODUODENOSCOPY (EGD) WITH PROPOFOL with CAPSULE PLACEMENT;  Surgeon: Jonathon Bellows, MD;  Location: [facility] ENDOSCOPY;  Service: Gastroenterology;  Laterality: N/A;  WITH GIVENS CAPSULE PLACEMENT   GIVENS CAPSULE STUDY N/A 06/13/2021   Procedure: GIVENS CAPSULE STUDY;  Surgeon: Jonathon Bellows, MD;  Location: [facility] ENDOSCOPY;  Service: Gastroenterology;  Laterality: N/A;   LIPOMA EXCISION  2003   mid back/ Dr Bary Castilla   pap smear  12/28/1998   Normala gain in 2023   ultrasound  03/09/2012   Hepayomegaly. Liver= 20.6cm    Social History   Socioeconomic History   Marital status: Married    Spouse name: Not on file   Number of children: 3   Years of education: Not on file   Highest education level: Not on file  Occupational History   Occupation: Web designer    Comment: works for [employer]  Tobacco Use   Smoking status: Never   Smokeless tobacco: Never  Vaping Use   Vaping Use: Never used  Substance and Sexual Activity   Alcohol use: No    Alcohol/week: 0.0 standard drinks of alcohol   Drug use: No   Sexual activity: Yes  Other Topics Concern   Not on file  Social History Narrative   Not on file   Social Determinants of Health   Financial Resource Strain: Not on file  Food Insecurity: Not on file  Transportation Needs: Not on file  Physical Activity: Not on file  Stress: Not on file  Social Connections: Not on file  Intimate Partner Violence: Not on file    Family History  Problem Relation Age of Onset   Diabetes Mother        type 2   Cancer Brother 11       bone passed age 68   Diabetes Father    Dementia Maternal Grandmother    Brain cancer Maternal Grandmother    Congestive Heart Failure Maternal Grandfather    Dementia Paternal Grandmother    Stroke Paternal Grandfather    Breast cancer Neg Hx      Current Outpatient Medications:    cyanocobalamin (VITAMIN B12) 1000 MCG/ML injection, Inject 1 ml intramuscularly once every month, Disp: 3  mL, Rfl: 3   DULoxetine (CYMBALTA) 30 MG capsule, Take 30 mg by mouth daily., Disp: , Rfl:    hydrochlorothiazide (HYDRODIURIL) 25 MG tablet, TAKE 1 TABLET BY MOUTH EVERY DAY, Disp: 90 tablet, Rfl: 4   Insulin Syringe-Needle U-100 25G X 1" 1 ML MISC, Use monthly for vitamin B12 injections, Disp: 12 each, Rfl: 1   metFORMIN (GLUCOPHAGE) 500 MG tablet, TAKE 1 TABLET BY MOUTH THREE TIMES A DAY, Disp: 270 tablet, Rfl: 3   pravastatin (PRAVACHOL) 20 MG tablet, TAKE 1 TABLET BY MOUTH EVERY DAY, Disp: 90 tablet, Rfl: 4   Vitamin D, Ergocalciferol, (DRISDOL) 1.25 MG (50000 UNIT) CAPS capsule, TAKE 1 CAPSULE BY MOUTH ONE TIME PER WEEK, Disp: 12 capsule, Rfl: 4  Physical exam:  Vitals:   12/04/21 1320  BP: 133/86  Pulse: 85  Temp: 98.6 F (37 C)  TempSrc: Tympanic  Weight: 193 lb 11.2 oz (87.9 kg)  Height: 4' 11.5" (1.511 m)   Physical Exam Cardiovascular:     Rate and Rhythm: Normal rate and regular rhythm.     Heart  sounds: Normal heart sounds.  Pulmonary:     Effort: Pulmonary effort is normal.     Breath sounds: Normal breath sounds.  Abdominal:     General: Bowel sounds are normal.     Palpations: Abdomen is soft.  Skin:    General: Skin is warm and dry.  Neurological:     Mental Status: She is alert and oriented to person, place, and time.         Latest Ref Rng & Units 11/16/2021    9:19 AM  CMP  Glucose 70 - 99 mg/dL 108   BUN 6 - 24 mg/dL 14   Creatinine 0.57 - 1.00 mg/dL 0.67   Sodium 134 - 144 mmol/L 142   Potassium 3.5 - 5.2 mmol/L 4.4   Chloride 96 - 106 mmol/L 96   CO2 20 - 29 mmol/L 27   Calcium 8.7 - 10.2 mg/dL 10.0   Total Protein 6.0 - 8.5 g/dL 7.4   Total Bilirubin 0.0 - 1.2 mg/dL 0.5   Alkaline Phos 44 - 121 IU/L 92   AST 0 - 40 IU/L 76   ALT 0 - 32 IU/L 138       Latest Ref Rng & Units 11/16/2021    9:19 AM  CBC  WBC 3.4 - 10.8 x10E3/uL 6.5   Hemoglobin 11.1 - 15.9 g/dL 14.4   Hematocrit 34.0 - 46.6 % 42.0   Platelets 150 - 450 x10E3/uL 239        Assessment and plan- Patient is a 56 y.o. female Here for routine follow-up of iron deficiency anemia  Patient last received IV iron in May 2023.Her hemoglobin which was down to 10.1 is presently up to 14.4.  Patient recently had blood work including B12 levels in September 2023 which were normal at 414.  Iron studies were not checked at that time.  Given that her hemoglobin is much improved and her iron studies just 3 months ago were normal I am holding off on repeat labs at this time.  I will repeat CBC ferritin and iron studies in 4 and 8 months and I will see her back in 8 months    Visit Diagnosis 1. Iron deficiency anemia, unspecified iron deficiency anemia type      Dr. Randa Evens, MD, MPH [facility] [phone] 12/04/2021 1:32 PM

## 2022-02-12 DIAGNOSIS — M159 Polyosteoarthritis, unspecified: Secondary | ICD-10-CM | POA: Insufficient documentation

## 2022-03-18 ENCOUNTER — Other Ambulatory Visit: Payer: Self-pay | Admitting: Family Medicine

## 2022-03-18 DIAGNOSIS — E119 Type 2 diabetes mellitus without complications: Secondary | ICD-10-CM

## 2022-04-09 ENCOUNTER — Inpatient Hospital Stay: Payer: BC Managed Care – PPO | Attending: Oncology

## 2022-04-09 DIAGNOSIS — D509 Iron deficiency anemia, unspecified: Secondary | ICD-10-CM | POA: Diagnosis not present

## 2022-04-09 LAB — CBC WITH DIFFERENTIAL/PLATELET
Abs Immature Granulocytes: 0.04 10*3/uL (ref 0.00–0.07)
Basophils Absolute: 0.1 10*3/uL (ref 0.0–0.1)
Basophils Relative: 1 %
Eosinophils Absolute: 0.4 10*3/uL (ref 0.0–0.5)
Eosinophils Relative: 5 %
HCT: 43.3 % (ref 36.0–46.0)
Hemoglobin: 14.4 g/dL (ref 12.0–15.0)
Immature Granulocytes: 1 %
Lymphocytes Relative: 26 %
Lymphs Abs: 2.1 10*3/uL (ref 0.7–4.0)
MCH: 29.6 pg (ref 26.0–34.0)
MCHC: 33.3 g/dL (ref 30.0–36.0)
MCV: 89.1 fL (ref 80.0–100.0)
Monocytes Absolute: 0.5 10*3/uL (ref 0.1–1.0)
Monocytes Relative: 6 %
Neutro Abs: 4.9 10*3/uL (ref 1.7–7.7)
Neutrophils Relative %: 61 %
Platelets: 268 10*3/uL (ref 150–400)
RBC: 4.86 MIL/uL (ref 3.87–5.11)
RDW: 12.6 % (ref 11.5–15.5)
WBC: 8 10*3/uL (ref 4.0–10.5)
nRBC: 0 % (ref 0.0–0.2)

## 2022-04-09 LAB — IRON AND TIBC
Iron: 62 ug/dL (ref 28–170)
Saturation Ratios: 14 % (ref 10.4–31.8)
TIBC: 444 ug/dL (ref 250–450)
UIBC: 382 ug/dL

## 2022-04-09 LAB — FERRITIN: Ferritin: 57 ng/mL (ref 11–307)

## 2022-05-29 NOTE — Progress Notes (Unsigned)
Vivien RotaI,Elena D DeSanto,acting as a scribe for Mila Merryonald Farren Nelles, MD.,have documented all relevant documentation on the behalf of Mila MerryDonald Alexus Michael, MD,as directed by  Mila Merryonald Yui Mulvaney, MD while in the presence of Mila Merryonald Kassandra Meriweather, MD.    Established patient visit   Patient: Kimberly Hancock   DOB: October 19, 1965   57 y.o. Female  MRN: [id number] Visit Date: 05/31/2022  Today's healthcare provider: Mila Merryonald Shanen Norris, MD   No chief complaint on file.  Subjective    HPI  Diabetes Mellitus Type II, Follow-up  Lab Results  Component Value Date   HGBA1C 6.2 (H) 11/16/2021   HGBA1C 6.3 (A) 05/15/2021   HGBA1C 6.3 (A) 11/13/2020   Wt Readings from Last 3 Encounters:  12/04/21 193 lb 11.2 oz (87.9 kg)  11/16/21 193 lb (87.5 kg)  09/11/21 196 lb (88.9 kg)   Last seen for diabetes 6 months ago.  Management since then includes none. She reports excellent compliance with treatment. She is not having side effects.  Symptoms: No fatigue No foot ulcerations  No appetite changes No nausea  No paresthesia of the feet  No polydipsia  No polyuria No visual disturbances   No vomiting     Home blood sugar records:  not being checked.   Most Recent Eye Exam: 3 months ago by patient report    Pertinent Labs: Lab Results  Component Value Date   CHOL 157 11/16/2021   HDL 56 11/16/2021   LDLCALC 86 11/16/2021   TRIG 81 11/16/2021   CHOLHDL 2.8 11/16/2021   Lab Results  Component Value Date   NA 142 11/16/2021   K 4.4 11/16/2021   CREATININE 0.67 11/16/2021   EGFR 103 11/16/2021   LABMICR <3.0 05/15/2021   MICRALBCREAT <5 05/15/2021     --------------------------------------------------------------------------------------------------- She also has sore red swollen left great toe for the last few weeks. She has h/o onychomycosis for which she has seen dermatology, but oral antifungals not prescribed due to underlying fatty liver disease.    Medications: Outpatient Medications Prior to Visit   Medication Sig   cyanocobalamin (VITAMIN B12) 1000 MCG/ML injection Inject 1 ml intramuscularly once every month   DULoxetine (CYMBALTA) 30 MG capsule Take 30 mg by mouth daily.   hydrochlorothiazide (HYDRODIURIL) 25 MG tablet TAKE 1 TABLET BY MOUTH EVERY DAY   Insulin Syringe-Needle U-100 25G X 1" 1 ML MISC Use monthly for vitamin B12 injections   metFORMIN (GLUCOPHAGE) 500 MG tablet TAKE 1 TABLET BY MOUTH THREE TIMES A DAY   pravastatin (PRAVACHOL) 20 MG tablet TAKE 1 TABLET BY MOUTH EVERY DAY   Vitamin D, Ergocalciferol, (DRISDOL) 1.25 MG (50000 UNIT) CAPS capsule TAKE 1 CAPSULE BY MOUTH ONE TIME PER WEEK   No facility-administered medications prior to visit.     {Labs  Heme  Chem  Endocrine  Serology  Results Review (optional):23779}   Objective    BP 134/71 (BP Location: Right Arm, Patient Position: Sitting, Cuff Size: Normal)   Pulse 84   Wt 202 lb 14.4 oz (92 kg)   LMP 06/07/2017   BMI 40.29 kg/m  {Show previous vital signs (optional):23777}  Physical Exam  General appearance: Obese female, cooperative and in no acute distress Head: Normocephalic, without obvious abnormality, atraumatic Respiratory: Respirations even and unlabored, normal respiratory rate Extremities: All extremities are intact.  Skin: Ingrown yellow dysmorphic left great toenail with surrounding tender, red swollen skin Psych: Appropriate mood and affect. Neurologic: Mental status: Alert, oriented to person, place, and time, thought content appropriate.  Results for orders placed or performed in visit on 05/31/22  POCT glycosylated hemoglobin (Hb A1C)  Result Value Ref Range   Hemoglobin A1C 6.5 (A) 4.0 - 5.6 %   Est. average glucose Bld gHb Est-mCnc 140      Assessment & Plan     1. Type 2 diabetes mellitus without complication, without long-term current use of insulin Doing well with metformin. Continue current medications.   - Urine microalbumin-creatinine with uACR  2. Ingrown left  big toenail  - cefdinir (OMNICEF) 300 MG capsule; Take 2 capsules (600 mg total) by mouth daily for 10 days.  Dispense: 20 capsule; Refill: 0  3. Onychomycosis Not candidate for continuous antifungal due to underlying fatty liver disease, but pulse dosing may be option. Discussed potential effects, she would like to try. Will start Lamisil pulse dosing after finishing cefdnir if liver functions are stable.   4. Elevated LFTs  - Hepatic function panel  5. Hepatomegaly Recommend follow up liver ultrasound but she does not want this done due to cost. Will hold off for now      The entirety of the information documented in the History of Present Illness, Review of Systems and Physical Exam were personally obtained by me. Portions of this information were initially documented by the CMA and reviewed by me for thoroughness and accuracy.     Mila Merryonald Emmaline Wahba, MD  [facility] [phone] (phone) [phone] (fax)  [facility]

## 2022-05-31 ENCOUNTER — Ambulatory Visit: Payer: BC Managed Care – PPO | Admitting: Family Medicine

## 2022-05-31 VITALS — BP 134/71 | HR 84 | Wt 202.9 lb

## 2022-05-31 DIAGNOSIS — E119 Type 2 diabetes mellitus without complications: Secondary | ICD-10-CM | POA: Diagnosis not present

## 2022-05-31 DIAGNOSIS — B351 Tinea unguium: Secondary | ICD-10-CM | POA: Diagnosis not present

## 2022-05-31 DIAGNOSIS — R7989 Other specified abnormal findings of blood chemistry: Secondary | ICD-10-CM | POA: Diagnosis not present

## 2022-05-31 DIAGNOSIS — R160 Hepatomegaly, not elsewhere classified: Secondary | ICD-10-CM

## 2022-05-31 DIAGNOSIS — L600 Ingrowing nail: Secondary | ICD-10-CM

## 2022-05-31 LAB — POCT GLYCOSYLATED HEMOGLOBIN (HGB A1C)
Est. average glucose Bld gHb Est-mCnc: 140
Hemoglobin A1C: 6.5 % — AB (ref 4.0–5.6)

## 2022-05-31 MED ORDER — CEFDINIR 300 MG PO CAPS: 600.0000 mg | ORAL_CAPSULE | Freq: Every day | ORAL | 0 refills | Status: AC

## 2022-06-01 LAB — HEPATIC FUNCTION PANEL
ALT: 149 IU/L — ABNORMAL HIGH (ref 0–32)
AST: 95 IU/L — ABNORMAL HIGH (ref 0–40)
Albumin: 4.5 g/dL (ref 3.8–4.9)
Alkaline Phosphatase: 96 IU/L (ref 44–121)
Bilirubin Total: 0.5 mg/dL (ref 0.0–1.2)
Bilirubin, Direct: 0.11 mg/dL (ref 0.00–0.40)
Total Protein: 7.4 g/dL (ref 6.0–8.5)

## 2022-06-01 LAB — MICROALBUMIN / CREATININE URINE RATIO
Creatinine, Urine: 81.1 mg/dL
Microalb/Creat Ratio: 9 mg/g creat (ref 0–29)
Microalbumin, Urine: 7.1 ug/mL

## 2022-06-06 ENCOUNTER — Other Ambulatory Visit: Payer: Self-pay | Admitting: Family Medicine

## 2022-06-06 DIAGNOSIS — E119 Type 2 diabetes mellitus without complications: Secondary | ICD-10-CM

## 2022-06-06 DIAGNOSIS — E559 Vitamin D deficiency, unspecified: Secondary | ICD-10-CM

## 2022-06-07 NOTE — Telephone Encounter (Signed)
Requested medication (s) are due for refill today: expired medication date  Requested medication (s) are on the active medication list: yes   Last refill:  04/04/21 #12 4 refills  Future visit scheduled: no   Notes to clinic:  manual review. Expired medication 04/04/21. Do you want to renew Rx?     Requested Prescriptions  Pending Prescriptions Disp Refills   Vitamin D, Ergocalciferol, (DRISDOL) 1.25 MG (50000 UNIT) CAPS capsule [Pharmacy Med Name: VITAMIN D2 1.25MG (50,000 UNIT)] 12 capsule 4    Sig: TAKE 1 CAPSULE BY MOUTH ONE TIME PER WEEK     Endocrinology:  Vitamins - Vitamin D Supplementation 2 Failed - 06/06/2022  9:26 PM      Failed - Manual Review: Route requests for 50,000 IU strength to the provider      Passed - Ca in normal range and within 360 days    Calcium  Date Value Ref Range Status  11/16/2021 10.0 8.7 - 10.2 mg/dL Final         Passed - Vitamin D in normal range and within 360 days    Vit D, 25-Hydroxy  Date Value Ref Range Status  11/01/2021 53.6  Final         Passed - Valid encounter within last 12 months    Recent Outpatient Visits           1 week ago Type 2 diabetes mellitus without complication, without long-term current use of insulin   [facility] Malva Limes, MD   6 months ago Type 2 diabetes mellitus without complication, without long-term current use of insulin (HCC)   [facility] Malva Limes, MD   1 year ago Type 2 diabetes mellitus without complication, without long-term current use of insulin (HCC)   [place] [facility] Alfredia Ferguson, PA-C   1 year ago Type 2 diabetes mellitus without complication, without long-term current use of insulin (HCC)   [place] [facility] Malva Limes, MD   1 year ago Type 2 diabetes mellitus without complication, without long-term current use of insulin (HCC)   [place] [facility] Malva Limes, MD              Signed Prescriptions Disp Refills   metFORMIN (GLUCOPHAGE) 500 MG tablet 270 tablet 0    Sig: TAKE 1 TABLET BY MOUTH THREE TIMES A DAY     Endocrinology:  Diabetes - Biguanides Passed - 06/06/2022  9:26 PM      Passed - Cr in normal range and within 360 days    Creatinine, Ser  Date Value Ref Range Status  11/16/2021 0.67 0.57 - 1.00 mg/dL Final         Passed - HBA1C is between 0 and 7.9 and within 180 days    Hemoglobin A1C  Date Value Ref Range Status  05/31/2022 6.5 (A) 4.0 - 5.6 % Final  08/09/2019 7.6  Final   Hgb A1c MFr Bld  Date Value Ref Range Status  11/16/2021 6.2 (H) 4.8 - 5.6 % Final    Comment:             Prediabetes: 5.7 - 6.4          Diabetes: >6.4          Glycemic control for adults with diabetes: <7.0          Passed - eGFR in normal range and within 360 days  GFR calc Af Amer  Date Value Ref Range Status  02/22/2020 117 >59 mL/min/1.73 Final    Comment:    **In accordance with recommendations from the NKF-ASN Task force,**   [facility] is in the process of updating its eGFR calculation to the   2021 CKD-EPI creatinine equation that estimates kidney function   without a race variable.    GFR calc non Af Amer  Date Value Ref Range Status  02/22/2020 101 >59 mL/min/1.73 Final   eGFR  Date Value Ref Range Status  11/16/2021 103 >59 mL/min/1.73 Final         Passed - B12 Level in normal range and within 720 days    Vitamin B-12  Date Value Ref Range Status  11/16/2021 414 232 - 1,245 pg/mL Final         Passed - Valid encounter within last 6 months    Recent Outpatient Visits           1 week ago Type 2 diabetes mellitus without complication, without long-term current use of insulin   [place] [facility] Malva Limes, MD   6 months ago Type 2 diabetes mellitus without complication, without long-term current use of insulin (HCC)   [place] [facility] Malva Limes, MD   1 year ago Type 2 diabetes mellitus without complication, without long-term current use of insulin (HCC)   [place] [facility] Edwards, [place], PA-C   1 year ago Type 2 diabetes mellitus without complication, without long-term current use of insulin (HCC)   [place] [facility] Malva Limes, MD   1 year ago Type 2 diabetes mellitus without complication, without long-term current use of insulin (HCC)    [facility] Malva Limes, MD              Passed - CBC within normal limits and completed in the last 12 months    WBC  Date Value Ref Range Status  04/09/2022 8.0 4.0 - 10.5 K/uL Final   RBC  Date Value Ref Range Status  04/09/2022 4.86 3.87 - 5.11 MIL/uL Final   Hemoglobin  Date Value Ref Range Status  04/09/2022 14.4 12.0 - 15.0 g/dL Final  16/11/9602 54.0 11.1 - 15.9 g/dL Final   HCT  Date Value Ref Range Status  04/09/2022 43.3 36.0 - 46.0 % Final   Hematocrit  Date Value Ref Range Status  11/16/2021 42.0 34.0 - 46.6 % Final   MCHC  Date Value Ref Range Status  04/09/2022 33.3 30.0 - 36.0 g/dL Final   [facility]  Date Value Ref Range Status  04/09/2022 29.6 26.0 - 34.0 pg Final   MCV  Date Value Ref Range Status  04/09/2022 89.1 80.0 - 100.0 fL Final  11/16/2021 87 79 - 97 fL Final   No results found for: "PLTCOUNTKUC", "LABPLAT", "POCPLA" RDW  Date Value Ref Range Status  04/09/2022 12.6 11.5 - 15.5 % Final  11/16/2021 13.0 11.7 - 15.4 % Final

## 2022-06-07 NOTE — Telephone Encounter (Signed)
Protocols met Requested Prescriptions  Pending Prescriptions Disp Refills   metFORMIN (GLUCOPHAGE) 500 MG tablet [Pharmacy Med Name: METFORMIN HCL 500 MG TABLET] 270 tablet 0    Sig: TAKE 1 TABLET BY MOUTH THREE TIMES A DAY     Endocrinology:  Diabetes - Biguanides Passed - 06/06/2022  9:26 PM      Passed - Cr in normal range and within 360 days    Creatinine, Ser  Date Value Ref Range Status  11/16/2021 0.67 0.57 - 1.00 mg/dL Final         Passed - HBA1C is between 0 and 7.9 and within 180 days    Hemoglobin A1C  Date Value Ref Range Status  05/31/2022 6.5 (A) 4.0 - 5.6 % Final  08/09/2019 7.6  Final   Hgb A1c MFr Bld  Date Value Ref Range Status  11/16/2021 6.2 (H) 4.8 - 5.6 % Final    Comment:             Prediabetes: 5.7 - 6.4          Diabetes: >6.4          Glycemic control for adults with diabetes: <7.0          Passed - eGFR in normal range and within 360 days    GFR calc Af Amer  Date Value Ref Range Status  02/22/2020 117 >59 mL/min/1.73 Final    Comment:    **In accordance with recommendations from the NKF-ASN Task force,**   [facility] is in the process of updating its eGFR calculation to the   2021 CKD-EPI creatinine equation that estimates kidney function   without a race variable.    GFR calc non Af Amer  Date Value Ref Range Status  02/22/2020 101 >59 mL/min/1.73 Final   eGFR  Date Value Ref Range Status  11/16/2021 103 >59 mL/min/1.73 Final         Passed - B12 Level in normal range and within 720 days    Vitamin B-12  Date Value Ref Range Status  11/16/2021 414 232 - 1,245 pg/mL Final         Passed - Valid encounter within last 6 months    Recent Outpatient Visits           1 week ago Type 2 diabetes mellitus without complication, without long-term current use of insulin   [place] [facility] Mal[facility] Limes, MD   6 months ago Type 2 diabetes mellitus without complication, without long-term current use of insulin (HCC)    [place] [facility] Malva Limes, MD   1 year ago Type 2 diabetes mellitus without complication, without long-term current use of insulin (HCC)   [place] [facility] Edwards, [place], PA-C   1 year ago Type 2 diabetes mellitus without complication, without long-term current use of insulin (HCC)   [place] [facility] Malva Limes, MD   1 year ago Type 2 diabetes mellitus without complication, without long-term current use of insulin (HCC)   [place] [facility] Malva Limes, MD              Passed - CBC within normal limits and completed in the last 12 months    WBC  Date Value Ref Range Status  04/09/2022 8.0 4.0 - 10.5 K/uL Final   RBC  Date Value Ref Range Status  04/09/2022 4.86 3.87 - 5.11 MIL/uL Final   Hemoglobin  Date Value  Ref Range Status  04/09/2022 14.4 12.0 - 15.0 g/dL Final  16/11/9602 54.0 11.1 - 15.9 g/dL Final   HCT  Date Value Ref Range Status  04/09/2022 43.3 36.0 - 46.0 % Final   Hematocrit  Date Value Ref Range Status  11/16/2021 42.0 34.0 - 46.6 % Final   MCHC  Date Value Ref Range Status  04/09/2022 33.3 30.0 - 36.0 g/dL Final   [facility]  Date Value Ref Range Status  04/09/2022 29.6 26.0 - 34.0 pg Final   MCV  Date Value Ref Range Status  04/09/2022 89.1 80.0 - 100.0 fL Final  11/16/2021 87 79 - 97 fL Final   No results found for: "PLTCOUNTKUC", "LABPLAT", "POCPLA" RDW  Date Value Ref Range Status  04/09/2022 12.6 11.5 - 15.5 % Final  11/16/2021 13.0 11.7 - 15.4 % Final          Vitamin D, Ergocalciferol, (DRISDOL) 1.25 MG (50000 UNIT) CAPS capsule [Pharmacy Med Name: VITAMIN D2 1.25MG (50,000 UNIT)] 12 capsule 4    Sig: TAKE 1 CAPSULE BY MOUTH ONE TIME PER WEEK     Endocrinology:  Vitamins - Vitamin D Supplementation 2 Failed - 06/06/2022  9:26 PM      Failed - Manual Review: Route requests for 50,000 IU strength to the provider      Passed - Ca in  normal range and within 360 days    Calcium  Date Value Ref Range Status  11/16/2021 10.0 8.7 - 10.2 mg/dL Final         Passed - Vitamin D in normal range and within 360 days    Vit D, 25-Hydroxy  Date Value Ref Range Status  11/01/2021 53.6  Final         Passed - Valid encounter within last 12 months    Recent Outpatient Visits           1 week ago Type 2 diabetes mellitus without complication, without long-term current use of insulin   [place] [facility] Malva Limes, MD   6 months ago Type 2 diabetes mellitus without complication, without long-term current use of insulin (HCC)   [place] [facility] Malva Limes, MD   1 year ago Type 2 diabetes mellitus without complication, without long-term current use of insulin (HCC)   [place] [facility] Alfredia Ferguson, PA-C   1 year ago Type 2 diabetes mellitus without complication, without long-term current use of insulin (HCC)   [place] [facility] Malva Limes, MD   1 year ago Type 2 diabetes mellitus without complication, without long-term current use of insulin (HCC)   [place] [facility] Malva Limes, MD

## 2022-06-10 ENCOUNTER — Encounter: Payer: Self-pay | Admitting: Family Medicine

## 2022-06-10 DIAGNOSIS — B351 Tinea unguium: Secondary | ICD-10-CM

## 2022-06-11 MED ORDER — TERBINAFINE HCL 250 MG PO TABS: 500.0000 mg | ORAL_TABLET | Freq: Every day | ORAL | 5 refills | Status: DC

## 2022-08-01 ENCOUNTER — Other Ambulatory Visit: Payer: Self-pay

## 2022-08-01 DIAGNOSIS — Z1231 Encounter for screening mammogram for malignant neoplasm of breast: Secondary | ICD-10-CM

## 2022-08-05 ENCOUNTER — Inpatient Hospital Stay: Payer: BC Managed Care – PPO | Attending: Oncology | Admitting: Oncology

## 2022-08-05 ENCOUNTER — Encounter: Payer: Self-pay | Admitting: Oncology

## 2022-08-05 ENCOUNTER — Inpatient Hospital Stay: Payer: BC Managed Care – PPO

## 2022-08-05 VITALS — BP 124/77 | HR 69 | Temp 98.2°F | Resp 18 | Ht 60.0 in | Wt 202.9 lb

## 2022-08-05 DIAGNOSIS — E559 Vitamin D deficiency, unspecified: Secondary | ICD-10-CM | POA: Diagnosis not present

## 2022-08-05 DIAGNOSIS — E039 Hypothyroidism, unspecified: Secondary | ICD-10-CM | POA: Insufficient documentation

## 2022-08-05 DIAGNOSIS — Z8616 Personal history of COVID-19: Secondary | ICD-10-CM | POA: Insufficient documentation

## 2022-08-05 DIAGNOSIS — M1990 Unspecified osteoarthritis, unspecified site: Secondary | ICD-10-CM | POA: Insufficient documentation

## 2022-08-05 DIAGNOSIS — Z809 Family history of malignant neoplasm, unspecified: Secondary | ICD-10-CM | POA: Insufficient documentation

## 2022-08-05 DIAGNOSIS — D509 Iron deficiency anemia, unspecified: Secondary | ICD-10-CM | POA: Insufficient documentation

## 2022-08-05 DIAGNOSIS — Z7984 Long term (current) use of oral hypoglycemic drugs: Secondary | ICD-10-CM | POA: Insufficient documentation

## 2022-08-05 DIAGNOSIS — Z79899 Other long term (current) drug therapy: Secondary | ICD-10-CM | POA: Insufficient documentation

## 2022-08-05 DIAGNOSIS — E119 Type 2 diabetes mellitus without complications: Secondary | ICD-10-CM | POA: Insufficient documentation

## 2022-08-05 LAB — FERRITIN: Ferritin: 44 ng/mL (ref 11–307)

## 2022-08-05 LAB — CBC WITH DIFFERENTIAL/PLATELET
Abs Immature Granulocytes: 0.03 10*3/uL (ref 0.00–0.07)
Basophils Absolute: 0.1 10*3/uL (ref 0.0–0.1)
Basophils Relative: 1 %
Eosinophils Absolute: 0.3 10*3/uL (ref 0.0–0.5)
Eosinophils Relative: 5 %
HCT: 43.2 % (ref 36.0–46.0)
Hemoglobin: 14.3 g/dL (ref 12.0–15.0)
Immature Granulocytes: 0 %
Lymphocytes Relative: 25 %
Lymphs Abs: 1.7 10*3/uL (ref 0.7–4.0)
MCH: 28.9 pg (ref 26.0–34.0)
MCHC: 33.1 g/dL (ref 30.0–36.0)
MCV: 87.4 fL (ref 80.0–100.0)
Monocytes Absolute: 0.4 10*3/uL (ref 0.1–1.0)
Monocytes Relative: 5 %
Neutro Abs: 4.5 10*3/uL (ref 1.7–7.7)
Neutrophils Relative %: 64 %
Platelets: 228 10*3/uL (ref 150–400)
RBC: 4.94 MIL/uL (ref 3.87–5.11)
RDW: 13 % (ref 11.5–15.5)
WBC: 7.1 10*3/uL (ref 4.0–10.5)
nRBC: 0 % (ref 0.0–0.2)

## 2022-08-05 LAB — IRON AND TIBC
Iron: 58 ug/dL (ref 28–170)
Saturation Ratios: 13 % (ref 10.4–31.8)
TIBC: 459 ug/dL — ABNORMAL HIGH (ref 250–450)
UIBC: 401 ug/dL

## 2022-08-05 NOTE — Progress Notes (Signed)
Hematology/Oncology Consult note [facility]  [phone] [phone]  Patient Care Team: Malva Limes, MD as PCP - General (Family Medicine) Lemar Livings, Merrily Pew, MD (General Surgery) Kandyce Rud., MD (Rheumatology) Sidney Ace, MD as Referring Physician (Allergy) Trish Mage, MD (Dermatology) Irene Limbo., MD (Ophthalmology) Christeen Douglas, MD as Consulting Physician (Obstetrics and Gynecology)   Name of the patient: Kimberly Hancock  [id number]  May 17, 1965   Date of visit: 08/05/22  Diagnosis-iron deficiency anemia  Chief complaint/ Reason for visit-routine follow-up of iron deficiency anemia  Heme/Onc history: patient is a 57 year old female with a past medical history significant for hypertension hyperlipidemia type 2 diabetes who has been referred forIron deficiency anemia.  She was seen by Dr. Tobi Bastos from GI in March 2023 she has had EGD and colonoscopy in the past which showed a tubular adenoma 5 mm in size and a hyperplastic gastric polyp..  She has not undergone capsule study in the past because of inability to swallow the capsule pill.  Further GI work-up for iron deficiency anemia by GI showed negative H. pylori breath testing, negative celiac disease panel.  Iron studies showed a low ferritin of 12 with iron saturation of 5% and elevated TIBC of 477.  Last CBC was from September 2022 when it was 10.8 with an MCV of 75.  B12 levels were mildly low at 290 and folate level normal at 10.4.   Interval history-patient has not required any IV iron for a year now.  She denies any blood loss in her stool or urine.  She is doing well overall.  ECOG PS- 1 Pain scale- 0   Review of systems- Review of Systems  Constitutional:  Negative for chills, fever, malaise/fatigue and weight loss.  HENT:  Negative for congestion, ear discharge and nosebleeds.   Eyes:  Negative for blurred vision.  Respiratory:  Negative for  cough, hemoptysis, sputum production, shortness of breath and wheezing.   Cardiovascular:  Negative for chest pain, palpitations, orthopnea and claudication.  Gastrointestinal:  Negative for abdominal pain, blood in stool, constipation, diarrhea, heartburn, melena, nausea and vomiting.  Genitourinary:  Negative for dysuria, flank pain, frequency, hematuria and urgency.  Musculoskeletal:  Negative for back pain, joint pain and myalgias.  Skin:  Negative for rash.  Neurological:  Negative for dizziness, tingling, focal weakness, seizures, weakness and headaches.  Endo/Heme/Allergies:  Does not bruise/bleed easily.  Psychiatric/Behavioral:  Negative for depression and suicidal ideas. The patient does not have insomnia.       Allergies  Allergen Reactions   Misc. Sulfonamide Containing Compounds Itching   Amoxicillin Itching   Sulfa Antibiotics Itching and Swelling     Past Medical History:  Diagnosis Date   ANA positive    Anemia    COVID-19 12/2018   Diabetes mellitus without complication (HCC)    Hepatomegaly    History of chicken pox    Hypothyroidism    Infected cyst of skin 1999   Osteoarthritis    Vitamin D deficiency      Past Surgical History:  Procedure Laterality Date   CESAREAN SECTION  07/03/2005   COLONOSCOPY WITH PROPOFOL N/A 06/28/2015   Procedure: COLONOSCOPY WITH PROPOFOL;  Surgeon: Earline Mayotte, MD;  Location: [facility] ENDOSCOPY;  Service: Endoscopy;  Laterality: N/A;   COLONOSCOPY WITH PROPOFOL N/A 03/13/2021   Procedure: COLONOSCOPY WITH PROPOFOL;  Surgeon: Wyline Mood, MD;  Location: [facility] ENDOSCOPY;  Service: Gastroenterology;  Laterality: N/A;   ESOPHAGOGASTRODUODENOSCOPY  N/A 03/13/2021   Procedure: ESOPHAGOGASTRODUODENOSCOPY (EGD);  Surgeon: Wyline Mood, MD;  Location[facility] ENDOSCOPY;  Service: Gastroenterology;  Laterality: N/A;   ESOPHAGOGASTRODUODENOSCOPY (EGD) WITH PROPOFOL N/A 06/13/2021   Procedure: ESOPHAGOGASTRODUODENOSCOPY (EGD) WITH PROPOFOL  with CAPSULE PLACEMENT;  Surgeon: Wyline Mood, MD;  Location: [facility] ENDOSCOPY;  Service: Gastroenterology;  Laterality: N/A;  WITH GIVENS CAPSULE PLACEMENT   GIVENS CAPSULE STUDY N/A 06/13/2021   Procedure: GIVENS CAPSULE STUDY;  Surgeon: Wyline Mood, MD;  Location: [facility] ENDOSCOPY;  Service: Gastroenterology;  Laterality: N/A;   LIPOMA EXCISION  2003   mid back/ Dr Lemar Livings   pap smear  12/28/1998   Normala gain in 2023   ultrasound  03/09/2012   Hepayomegaly. Liver= 20.6cm    Social History   Socioeconomic History   Marital status: Married    Spouse name: Not on file   Number of children: 3   Years of education: Not on file   Highest education level: Bachelor's degree (e.g., BA, AB, BS)  Occupational History   Occupation: Environmental health practitioner    Comment: works for [employer]  Tobacco Use   Smoking status: Never   Smokeless tobacco: Never  Vaping Use   Vaping Use: Never used  Substance and Sexual Activity   Alcohol use: No    Alcohol/week: 0.0 standard drinks of alcohol   Drug use: No   Sexual activity: Yes  Other Topics Concern   Not on file  Social History Narrative   Not on file   Social Determinants of Health   Financial Resource Strain: Low Risk  (05/30/2022)   Overall Financial Resource Strain (CARDIA)    Difficulty of Paying Living Expenses: Not very hard  Food Insecurity: No Food Insecurity (05/30/2022)   Hunger Vital Sign    Worried About Running Out of Food in the Last Year: Never true    Ran Out of Food in the Last Year: Never true  Transportation Needs: No Transportation Needs (05/30/2022)   PRAPARE - Administrator, Civil Service (Medical): No    Lack of Transportation (Non-Medical): No  Physical Activity: Insufficiently Active (05/30/2022)   Exercise Vital Sign    Days of Exercise per Week: 1 day    Minutes of Exercise per Session: 20 min  Stress: No Stress Concern Present (05/30/2022)   [employer] of Occupational Health - Occupational  Stress Questionnaire    Feeling of Stress : Not at all  Social Connections: Socially Integrated (05/30/2022)   Social Connection and Isolation Panel [NHANES]    Frequency of Communication with Friends and Family: More than three times a week    Frequency of Social Gatherings with Friends and Family: Twice a week    Attends Religious Services: More than 4 times per year    Active Member of [employer] or Organizations: Yes    Attends Engineer, structural: More than 4 times per year    Marital Status: Married  Catering manager Violence: Not on file    Family History  Problem Relation Age of Onset   Diabetes Mother        type 2   Cancer Brother 11       bone passed age 62   Diabetes Father    Dementia Maternal Grandmother    Brain cancer Maternal Grandmother    Congestive Heart Failure Maternal Grandfather    Dementia Paternal Grandmother    Stroke Paternal Grandfather    Breast cancer Neg Hx      Current Outpatient Medications:  cyanocobalamin (VITAMIN B12) 1000 MCG/ML injection, Inject 1 ml intramuscularly once every month, Disp: 3 mL, Rfl: 3   gabapentin (NEURONTIN) 300 MG capsule, TAKE 1 CAPSULE BY MOUTH EVERYDAY AT BEDTIME, Disp: , Rfl:    hydrochlorothiazide (HYDRODIURIL) 25 MG tablet, TAKE 1 TABLET BY MOUTH EVERY DAY, Disp: 90 tablet, Rfl: 4   Insulin Syringe-Needle U-100 25G X 1" 1 ML MISC, Use monthly for vitamin B12 injections, Disp: 12 each, Rfl: 1   metFORMIN (GLUCOPHAGE) 500 MG tablet, TAKE 1 TABLET BY MOUTH THREE TIMES A DAY, Disp: 270 tablet, Rfl: 0   pravastatin (PRAVACHOL) 20 MG tablet, TAKE 1 TABLET BY MOUTH EVERY DAY, Disp: 90 tablet, Rfl: 4   terbinafine (LAMISIL) 250 MG tablet, Take 2 tablets (500 mg total) by mouth daily. Take for 1 week out of every 4 weeks, Disp: 14 tablet, Rfl: 5   Vitamin D, Ergocalciferol, (DRISDOL) 1.25 MG (50000 UNIT) CAPS capsule, TAKE 1 CAPSULE BY MOUTH ONE TIME PER WEEK, Disp: 12 capsule, Rfl: 4  Physical exam:  Vitals:    08/05/22 1320  BP: 124/77  Pulse: 69  Resp: 18  Temp: 98.2 F (36.8 C)  TempSrc: Tympanic  SpO2: 99%  Weight: 202 lb 14.4 oz (92 kg)  Height: 5' (1.524 m)   Physical Exam Cardiovascular:     Rate and Rhythm: Normal rate and regular rhythm.     Heart sounds: Normal heart sounds.  Pulmonary:     Effort: Pulmonary effort is normal.  Skin:    General: Skin is warm and dry.  Neurological:     Mental Status: She is alert and oriented to person, place, and time.         Latest Ref Rng & Units 05/31/2022    8:57 AM  CMP  Total Protein 6.0 - 8.5 g/dL 7.4   Total Bilirubin 0.0 - 1.2 mg/dL 0.5   Alkaline Phos 44 - 121 IU/L 96   AST 0 - 40 IU/L 95   ALT 0 - 32 IU/L 149       Latest Ref Rng & Units 08/05/2022    1:01 PM  CBC  WBC 4.0 - 10.5 K/uL 7.1   Hemoglobin 12.0 - 15.0 g/dL 16.1   Hematocrit 09.6 - 46.0 % 43.2   Platelets 150 - 400 K/uL 228      Assessment and plan- Patient is a 57 y.o. female here for routine follow-up of iron deficiency anemia  Patient last received IV iron over a year ago.  Hemoglobin has remained stable around 14 since then.  Iron studies are pending but I do not think that she will need any IV iron at this time.  Given the stability of her labs she does not require any follow-up with me at this time and can continue to follow-up with Dr. Sherrie Mustache.  She can be referred to [place] in the future if questions or concerns arise   Visit Diagnosis 1. Iron deficiency anemia, unspecified iron deficiency anemia type      Dr. Owens Shark, MD, MPH [facility] [phone] 08/05/2022 2:08 PM

## 2022-09-06 ENCOUNTER — Other Ambulatory Visit: Payer: Self-pay | Admitting: Family Medicine

## 2022-09-06 DIAGNOSIS — E538 Deficiency of other specified B group vitamins: Secondary | ICD-10-CM

## 2022-09-06 DIAGNOSIS — E119 Type 2 diabetes mellitus without complications: Secondary | ICD-10-CM

## 2022-09-16 ENCOUNTER — Ambulatory Visit
Admission: RE | Admit: 2022-09-16 | Discharge: 2022-09-16 | Disposition: A | Discharge status: Home / Self Care | Payer: BC Managed Care – PPO | Source: Ambulatory Visit

## 2022-09-16 DIAGNOSIS — Z1231 Encounter for screening mammogram for malignant neoplasm of breast: Secondary | ICD-10-CM | POA: Insufficient documentation

## 2022-11-26 ENCOUNTER — Other Ambulatory Visit: Payer: Self-pay | Admitting: Family Medicine

## 2022-11-26 DIAGNOSIS — E119 Type 2 diabetes mellitus without complications: Secondary | ICD-10-CM

## 2022-11-26 DIAGNOSIS — B351 Tinea unguium: Secondary | ICD-10-CM

## 2023-02-21 ENCOUNTER — Other Ambulatory Visit: Payer: Self-pay

## 2023-02-21 ENCOUNTER — Other Ambulatory Visit: Payer: Self-pay | Admitting: Family Medicine

## 2023-02-21 ENCOUNTER — Encounter: Payer: Self-pay | Admitting: Family Medicine

## 2023-02-21 DIAGNOSIS — E119 Type 2 diabetes mellitus without complications: Secondary | ICD-10-CM

## 2023-02-21 MED ORDER — PRAVASTATIN SODIUM 20 MG PO TABS: 20.0000 mg | ORAL_TABLET | Freq: Every day | ORAL | 0 refills | Status: DC

## 2023-02-21 MED ORDER — METFORMIN HCL 500 MG PO TABS: 500.0000 mg | ORAL_TABLET | Freq: Three times a day (TID) | ORAL | 0 refills | Status: DC

## 2023-02-24 ENCOUNTER — Encounter: Payer: Self-pay | Admitting: Oncology

## 2023-02-27 ENCOUNTER — Encounter: Payer: Self-pay | Admitting: Oncology

## 2023-03-14 ENCOUNTER — Encounter: Payer: Self-pay | Admitting: Oncology

## 2023-03-16 DIAGNOSIS — M1711 Unilateral primary osteoarthritis, right knee: Secondary | ICD-10-CM | POA: Insufficient documentation

## 2023-03-18 ENCOUNTER — Ambulatory Visit: Payer: 59 | Admitting: Family Medicine

## 2023-03-18 ENCOUNTER — Encounter: Payer: Self-pay | Admitting: Family Medicine

## 2023-03-18 VITALS — BP 146/73 | HR 87 | Resp 16 | Ht 60.0 in | Wt 202.2 lb

## 2023-03-18 DIAGNOSIS — E66813 Obesity, class 3: Secondary | ICD-10-CM | POA: Diagnosis not present

## 2023-03-18 DIAGNOSIS — E538 Deficiency of other specified B group vitamins: Secondary | ICD-10-CM

## 2023-03-18 DIAGNOSIS — Z7984 Long term (current) use of oral hypoglycemic drugs: Secondary | ICD-10-CM

## 2023-03-18 DIAGNOSIS — Z23 Encounter for immunization: Secondary | ICD-10-CM | POA: Diagnosis not present

## 2023-03-18 DIAGNOSIS — M1711 Unilateral primary osteoarthritis, right knee: Secondary | ICD-10-CM | POA: Diagnosis not present

## 2023-03-18 DIAGNOSIS — K760 Fatty (change of) liver, not elsewhere classified: Secondary | ICD-10-CM

## 2023-03-18 DIAGNOSIS — E1169 Type 2 diabetes mellitus with other specified complication: Secondary | ICD-10-CM | POA: Diagnosis not present

## 2023-03-18 DIAGNOSIS — E559 Vitamin D deficiency, unspecified: Secondary | ICD-10-CM

## 2023-03-18 DIAGNOSIS — Z6839 Body mass index (BMI) 39.0-39.9, adult: Secondary | ICD-10-CM

## 2023-03-18 DIAGNOSIS — E119 Type 2 diabetes mellitus without complications: Secondary | ICD-10-CM

## 2023-03-18 LAB — POCT GLYCOSYLATED HEMOGLOBIN (HGB A1C)
Est. average glucose Bld gHb Est-mCnc: 157
Hemoglobin A1C: 7.1 % — AB (ref 4.0–5.6)

## 2023-03-18 NOTE — Progress Notes (Signed)
Established patient visit   Patient: Kimberly Hancock   DOB: [birth year]   58 y.o. Female  MRN: [id number] Visit Date: 03/18/2023  Today's healthcare provider: Mila Merry, MD   Chief Complaint  Patient presents with   Medical Management of Chronic Issues    T2DM   Diabetes   Hyperlipidemia   Hypertension   Subjective    Discussed the use of AI scribe software for clinical note transcription with the patient, who gave verbal consent to proceed.  History of Present Illness   The patient presents for follow up type 2 diabetes, fatty liver disease, obesity and hypertension. She reports worsening right knee pain over the past couple of weeks. The pain, initially intermittent, has progressively worsened, reaching a peak over the weekend.  She recently went to orthopedics where x-rays were performed and a cortisone shot was administered. The patient reports no relief from the cortisone shot and has been self-medicating with Advil, despite previous advice against NSAIDs due to fatty liver disease.   She continues on metformin for diabetes and admits to reduced activity due to the knee pain.  The patient also mentions a longstanding issue with a toenail fungus for which she has been taking oral Lamisil for 1 week per month for several month with no significant improvement.     Lab Results  Component Value Date   HGBA1C 7.1 (A) 03/18/2023   HGBA1C 6.5 (A) 05/31/2022   HGBA1C 6.2 (H) 11/16/2021   Last metabolic panel Lab Results  Component Value Date   GLUCOSE 108 (H) 11/16/2021   NA 142 11/16/2021   K 4.4 11/16/2021   CL 96 11/16/2021   CO2 27 11/16/2021   BUN 14 11/16/2021   CREATININE 0.67 11/16/2021   EGFR 103 11/16/2021   CALCIUM 10.0 11/16/2021   PHOS 4.1 02/22/2020   PROT 7.4 05/31/2022   ALBUMIN 4.5 05/31/2022   LABGLOB 2.6 11/16/2021   AGRATIO 1.8 11/16/2021   BILITOT 0.5 05/31/2022   ALKPHOS 96 05/31/2022   AST 95 (H) 05/31/2022   ALT 149 (H) 05/31/2022    Lab Results  Component Value Date   PLT 228 08/05/2022     Medications: Outpatient Medications Prior to Visit  Medication Sig   cyanocobalamin (VITAMIN B12) 1000 MCG/ML injection INJECT 1 ML INTRAMUSCULARLY ONCE EVERY MONTH   hydrochlorothiazide (HYDRODIURIL) 25 MG tablet TAKE 1 TABLET BY MOUTH EVERY DAY   Insulin Syringe-Needle U-100 25G X 1" 1 ML MISC Use monthly for vitamin B12 injections   metFORMIN (GLUCOPHAGE) 500 MG tablet Take 1 tablet (500 mg total) by mouth 3 (three) times daily.   pravastatin (PRAVACHOL) 20 MG tablet Take 1 tablet (20 mg total) by mouth daily.   terbinafine (LAMISIL) 250 MG tablet TAKE 2 TABLETS (500 MG TOTAL) BY MOUTH DAILY. TAKE FOR 1 WEEK OUT OF EVERY 4 WEEKS   Vitamin D, Ergocalciferol, (DRISDOL) 1.25 MG (50000 UNIT) CAPS capsule TAKE 1 CAPSULE BY MOUTH ONE TIME PER WEEK   gabapentin (NEURONTIN) 300 MG capsule TAKE 1 CAPSULE BY MOUTH EVERYDAY AT BEDTIME   No facility-administered medications prior to visit.   (optional):1}   Objective    BP (!) 146/73 (BP Location: Left Arm, Patient Position: Sitting, Cuff Size: Large)   Pulse 87   Resp 16   Ht 5' (1.524 m)   Wt 202 lb 3.2 oz (91.7 kg)   LMP 06/07/2017   BMI 39.49 kg/m   Physical Exam  General appearance: Mildly obese female,  cooperative and in no acute distress Head: Normocephalic, without obvious abnormality, atraumatic Respiratory: Respirations even and unlabored, normal respiratory rate Extremities: All extremities are intact.  Skin: Skin color, texture, turgor normal. No rashes seen  Psych: Appropriate mood and affect. Neurologic: Mental status: Alert, oriented to person, place, and time, thought content appropriate.     Results for orders placed or performed in visit on 03/18/23  POCT glycosylated hemoglobin (Hb A1C)  Result Value Ref Range   Hemoglobin A1C 7.1 (A) 4.0 - 5.6 %   Est. average glucose Bld gHb Est-mCnc 157     Assessment & Plan     1. Type 2 diabetes mellitus  without complication, without long-term current use of insulin (HCC) (Primary) A1c up. Continue metformin. Consider adding semaglutide.  - Pneumococcal conjugate vaccine 20-valent (Prevnar 20) - Lipid panel  2. Fatty liver Discussed role of semaglutide for fatty liver disease. Anticipate starting low dose after reviewing labs.  - CBC - Comprehensive metabolic panel  3. Class 3 severe obesity with serious comorbidity in adult, unspecified BMI, unspecified obesity type (HCC)  - TSH  4. Primary osteoarthritis of right knee Follow up ortho as scheduled.   5. Vitamin D deficiency  - VITAMIN D 25 Hydroxy (Vit-D Deficiency, Fractures)  6. B12 deficiency  - Vitamin B12   D/c oral Lamisil as she has not seen any significant improvement in dysmorphic toenails. Consider podiatry referral.      Mila Merry, MD  [facility] [phone] (phone) [phone] (fax)  [facility]

## 2023-03-19 ENCOUNTER — Other Ambulatory Visit: Payer: Self-pay | Admitting: Family Medicine

## 2023-03-19 ENCOUNTER — Encounter: Payer: Self-pay | Admitting: Family Medicine

## 2023-03-19 DIAGNOSIS — E119 Type 2 diabetes mellitus without complications: Secondary | ICD-10-CM

## 2023-03-19 DIAGNOSIS — K760 Fatty (change of) liver, not elsewhere classified: Secondary | ICD-10-CM

## 2023-03-19 LAB — LIPID PANEL
Chol/HDL Ratio: 3 {ratio} (ref 0.0–4.4)
Cholesterol, Total: 187 mg/dL (ref 100–199)
HDL: 62 mg/dL (ref >39)
LDL Chol Calc (NIH): 108 mg/dL — ABNORMAL HIGH (ref 0–99)
Triglycerides: 95 mg/dL (ref 0–149)
VLDL Cholesterol Cal: 17 mg/dL (ref 5–40)

## 2023-03-19 LAB — VITAMIN B12: Vitamin B-12: 491 pg/mL (ref 232–1245)

## 2023-03-19 LAB — TSH: TSH: 4.1 u[IU]/mL (ref 0.450–4.500)

## 2023-03-19 LAB — CBC
Hematocrit: 42.2 % (ref 34.0–46.6)
Hemoglobin: 13.7 g/dL (ref 11.1–15.9)
MCH: 28.5 pg (ref 26.6–33.0)
MCHC: 32.5 g/dL (ref 31.5–35.7)
MCV: 88 fL (ref 79–97)
Platelets: 276 10*3/uL (ref 150–450)
RBC: 4.8 x10E6/uL (ref 3.77–5.28)
RDW: 12.7 % (ref 11.7–15.4)
WBC: 8.5 10*3/uL (ref 3.4–10.8)

## 2023-03-19 LAB — COMPREHENSIVE METABOLIC PANEL
ALT: 118 [IU]/L — ABNORMAL HIGH (ref 0–32)
AST: 74 [IU]/L — ABNORMAL HIGH (ref 0–40)
Albumin: 4.6 g/dL (ref 3.8–4.9)
Alkaline Phosphatase: 107 [IU]/L (ref 44–121)
BUN/Creatinine Ratio: 18 (ref 9–23)
BUN: 13 mg/dL (ref 6–24)
Bilirubin Total: 0.3 mg/dL (ref 0.0–1.2)
CO2: 22 mmol/L (ref 20–29)
Calcium: 9.9 mg/dL (ref 8.7–10.2)
Chloride: 101 mmol/L (ref 96–106)
Creatinine, Ser: 0.74 mg/dL (ref 0.57–1.00)
Globulin, Total: 2.9 g/dL (ref 1.5–4.5)
Glucose: 156 mg/dL — ABNORMAL HIGH (ref 70–99)
Potassium: 5.2 mmol/L (ref 3.5–5.2)
Sodium: 141 mmol/L (ref 134–144)
Total Protein: 7.5 g/dL (ref 6.0–8.5)
eGFR: 94 mL/min/{1.73_m2} (ref >59)

## 2023-03-19 LAB — VITAMIN D 25 HYDROXY (VIT D DEFICIENCY, FRACTURES): Vit D, 25-Hydroxy: 75.5 ng/mL (ref 30.0–100.0)

## 2023-03-19 MED ORDER — OZEMPIC (0.25 OR 0.5 MG/DOSE) 2 MG/3ML ~~LOC~~ SOPN: PEN_INJECTOR | SUBCUTANEOUS | 2 refills | Status: DC

## 2023-03-20 ENCOUNTER — Encounter: Payer: Self-pay | Admitting: Family Medicine

## 2023-04-03 DIAGNOSIS — M25561 Pain in right knee: Secondary | ICD-10-CM | POA: Insufficient documentation

## 2023-04-03 DIAGNOSIS — R600 Localized edema: Secondary | ICD-10-CM | POA: Insufficient documentation

## 2023-05-01 IMAGING — MG MM DIGITAL SCREENING BILAT W/ TOMO AND CAD
8 series · 8 of 24 positions shown · non-contrast
Comparison: Previous exam(s).

CLINICAL DATA: Screening.

EXAM:
DIGITAL SCREENING BILATERAL MAMMOGRAM WITH TOMOSYNTHESIS AND CAD
TECHNIQUE: Bilateral screening digital craniocaudal and mediolateral oblique
mammograms were obtained. Bilateral screening digital breast
tomosynthesis was performed. The images were evaluated with
computer-aided detection.

[L MLO synth-2D]
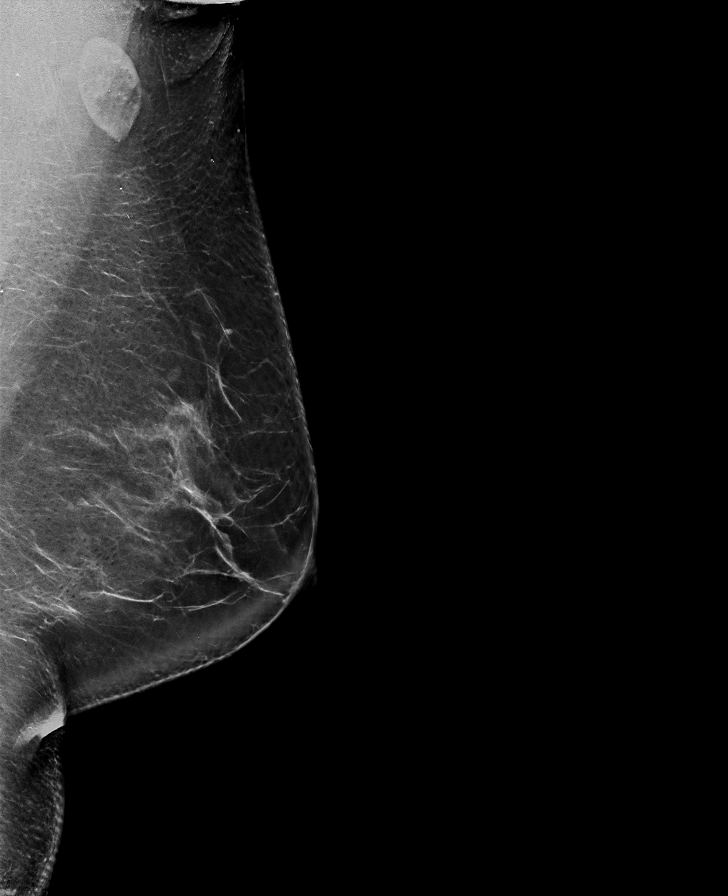

[R CC synth-2D]
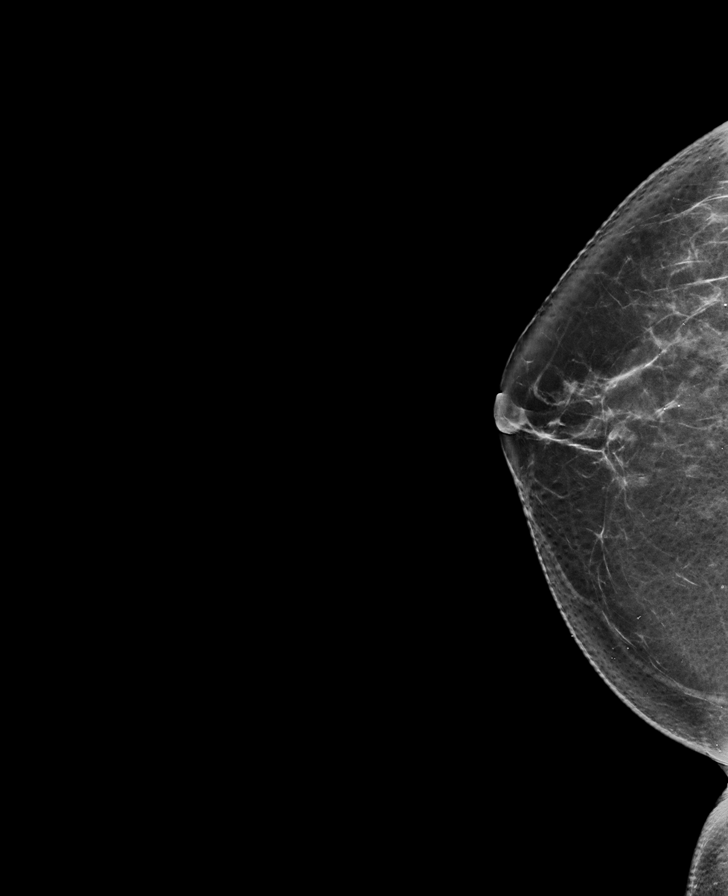

[L CC synth-2D]
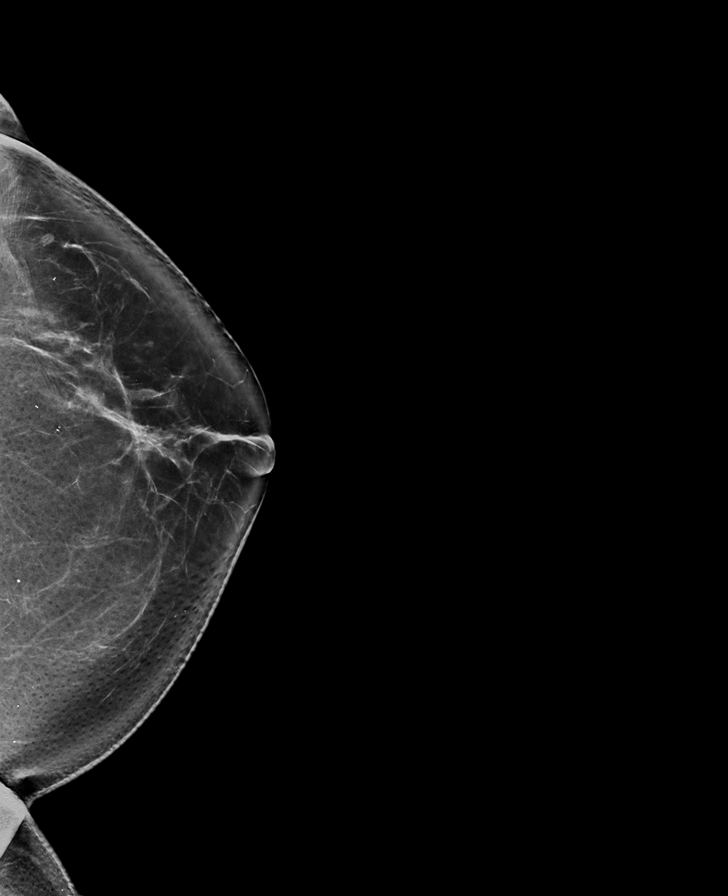

[R MLO synth-2D]
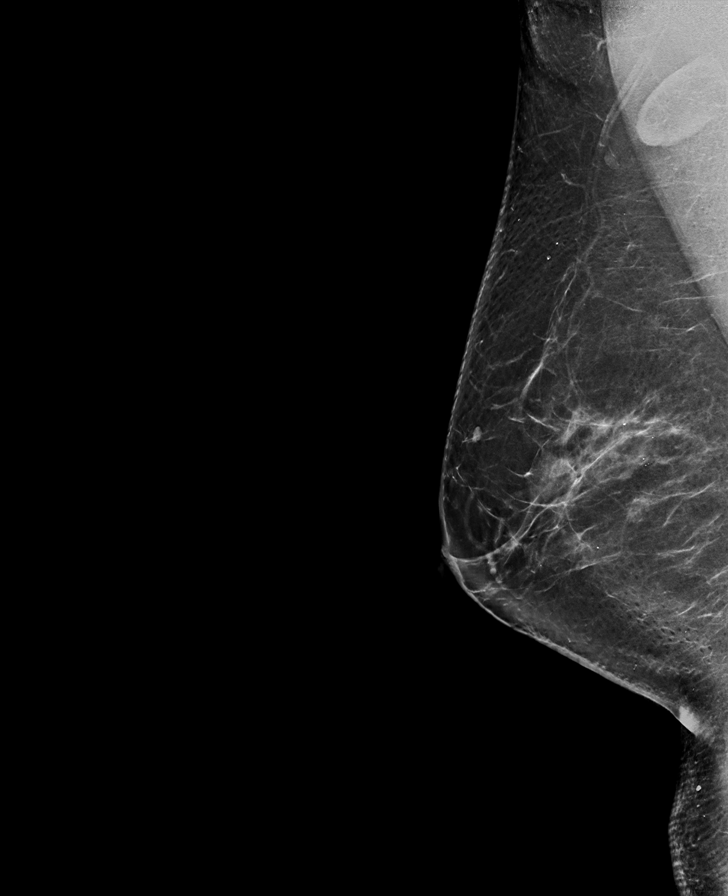

[R CC tomo · tomo slice 39/77.0]
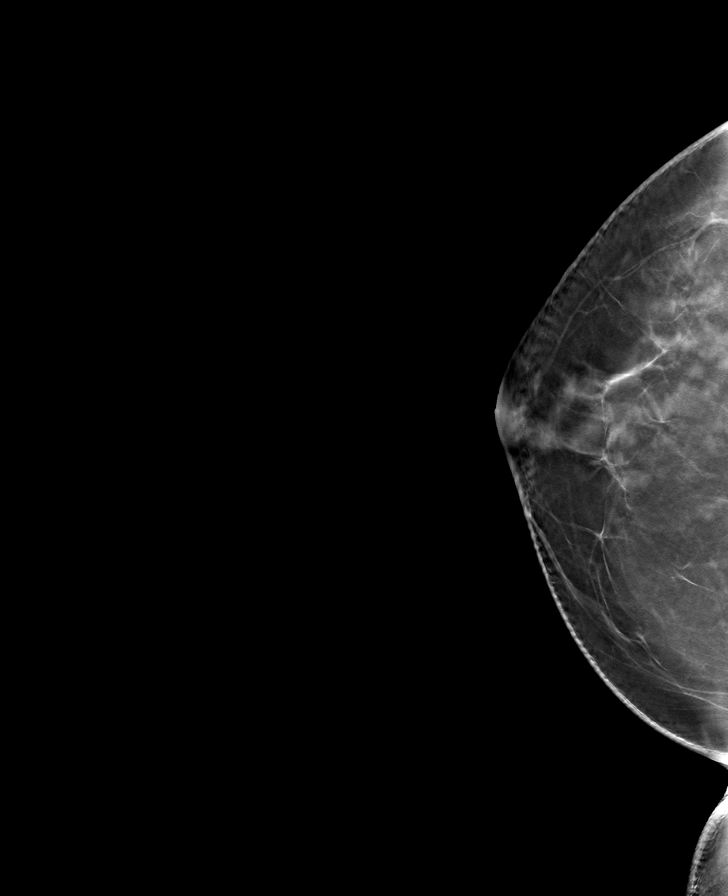

[L MLO tomo · tomo slice 51/101.0]
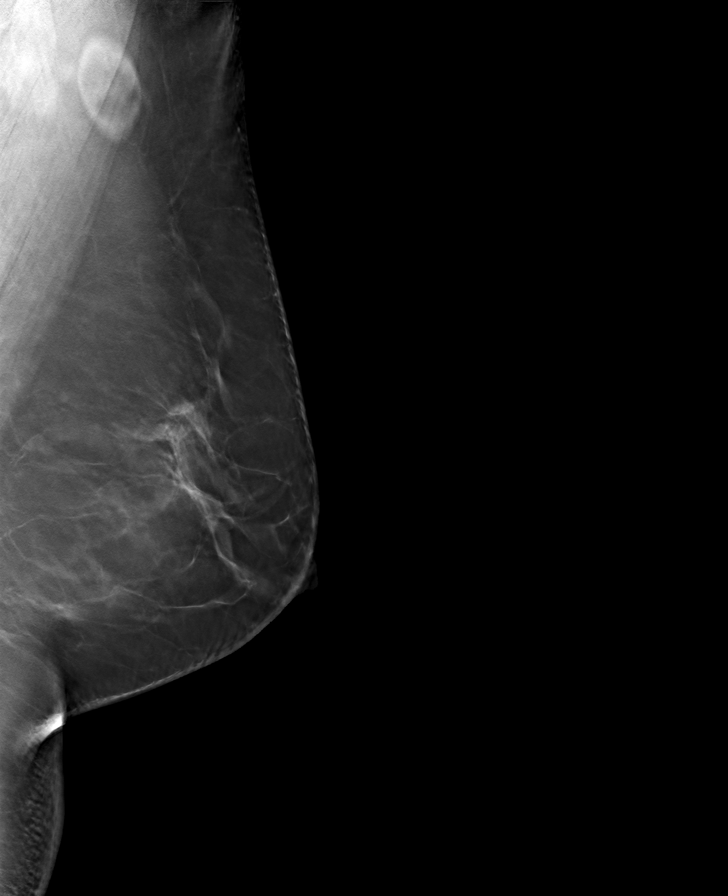

[L CC tomo · tomo slice 45/90.0]
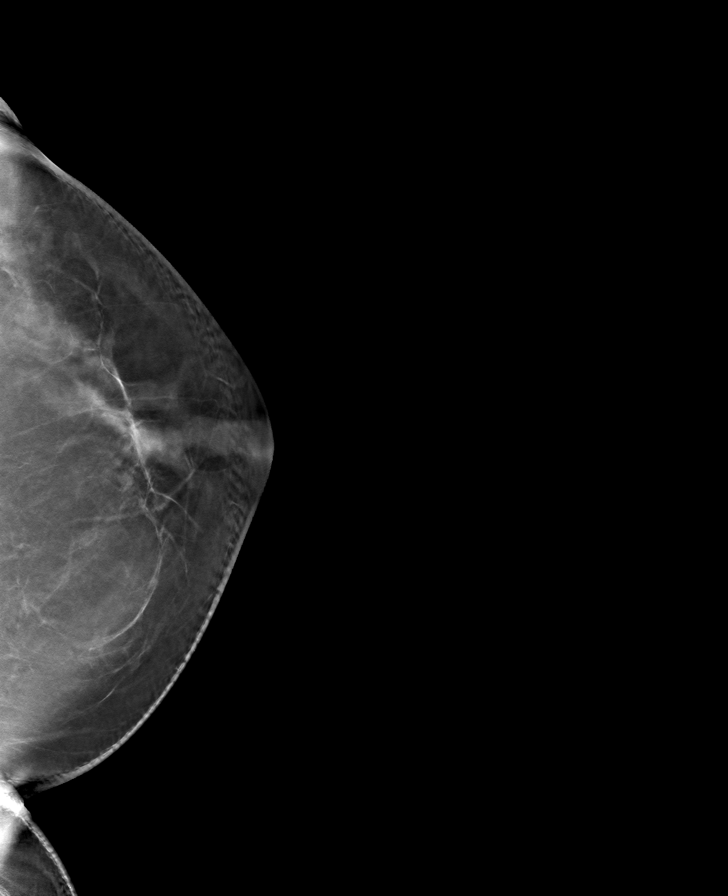

[R MLO tomo · tomo slice 45/89.0]
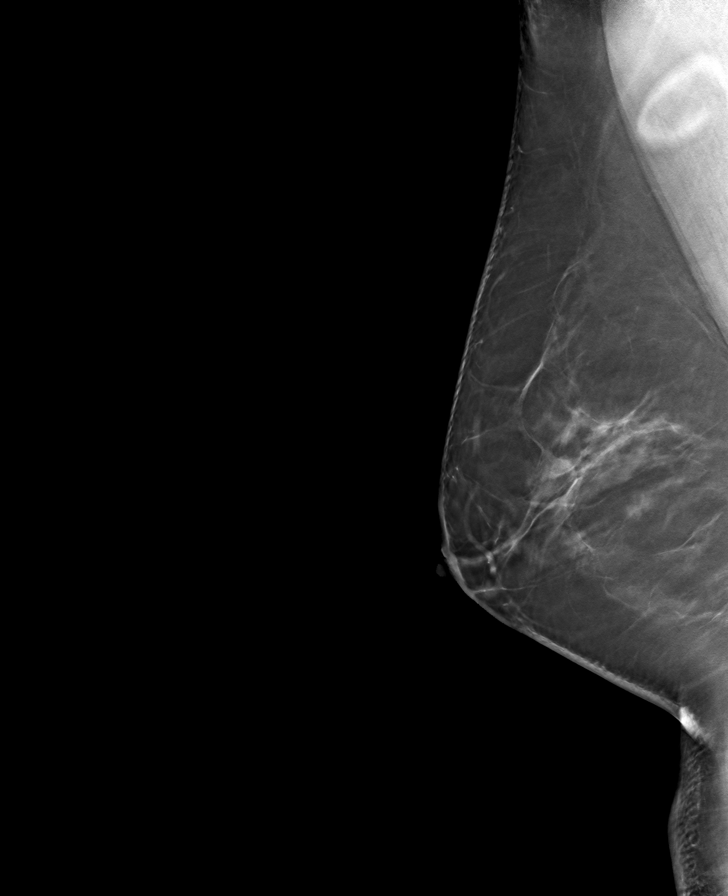

[8 of 24 positions shown; findings below may reference images not displayed]

ACR Breast Density Category b: There are scattered areas of
fibroglandular density.
FINDINGS: There are no findings suspicious for malignancy.
IMPRESSION: No mammographic evidence of malignancy. A result letter of this
screening mammogram will be mailed directly to the patient.

RECOMMENDATION:
Screening mammogram in one year. (Code:51-O-LD2)

BI-RADS CATEGORY  1: Negative.

## 2023-05-09 ENCOUNTER — Other Ambulatory Visit: Payer: Self-pay | Admitting: Family Medicine

## 2023-05-09 ENCOUNTER — Encounter: Payer: Self-pay | Admitting: Family Medicine

## 2023-05-09 DIAGNOSIS — E119 Type 2 diabetes mellitus without complications: Secondary | ICD-10-CM

## 2023-05-10 ENCOUNTER — Other Ambulatory Visit: Payer: Self-pay | Admitting: Family Medicine

## 2023-05-10 MED ORDER — METFORMIN HCL 500 MG PO TABS: 500.0000 mg | ORAL_TABLET | Freq: Three times a day (TID) | ORAL | 4 refills | Status: DC

## 2023-05-10 MED ORDER — PRAVASTATIN SODIUM 20 MG PO TABS: 20.0000 mg | ORAL_TABLET | Freq: Every day | ORAL | 2 refills | Status: DC

## 2023-05-12 ENCOUNTER — Ambulatory Visit: Payer: Self-pay | Admitting: Family Medicine

## 2023-05-12 ENCOUNTER — Encounter: Payer: Self-pay | Admitting: Family Medicine

## 2023-05-12 ENCOUNTER — Encounter: Payer: Self-pay | Admitting: Oncology

## 2023-05-12 ENCOUNTER — Ambulatory Visit (INDEPENDENT_AMBULATORY_CARE_PROVIDER_SITE_OTHER): Admitting: Family Medicine

## 2023-05-12 VITALS — BP 129/83 | HR 88 | Temp 97.8°F | Ht 60.0 in | Wt 192.4 lb

## 2023-05-12 DIAGNOSIS — R350 Frequency of micturition: Secondary | ICD-10-CM

## 2023-05-12 LAB — POCT URINALYSIS DIPSTICK
Bilirubin, UA: NEGATIVE
Glucose, UA: NEGATIVE
Ketones, UA: NEGATIVE
Nitrite, UA: POSITIVE
Protein, UA: NEGATIVE
Spec Grav, UA: 1.02 (ref 1.010–1.025)
Urobilinogen, UA: 1 U/dL
pH, UA: 7 (ref 5.0–8.0)

## 2023-05-12 MED ORDER — NITROFURANTOIN MONOHYD MACRO 100 MG PO CAPS: 100.0000 mg | ORAL_CAPSULE | Freq: Two times a day (BID) | ORAL | 0 refills | Status: AC

## 2023-05-12 NOTE — Patient Instructions (Signed)

## 2023-05-12 NOTE — Progress Notes (Signed)
 Acute Care Office Visit  Subjective:   Kimberly Hancock [birth year] 05/12/2023  Kimberly Hancock is a 58 year old female patient who presents today with concerns for possible UTI. She reports her symptoms started last night and continued this morning. She has taken Azo with some relief and has been trying to stay hydrated.   Symptoms Urgency: no  Frequency: yes  Incontinence: yes  Dysuria: yes Hesitancy: no  Hematuria: no  Flank Pain: no  Fever: no          Nausea/Vomiting: no  Pregnant: no STD exposure/history: no Discharge: no Irritants: no Rash: no  Red Flags: (Risk Factors for Complicated UTI) Recent Antibiotic Usage (last 30 days): no  Symptoms lasting more than seven (7) days: no  More than 3 UTI's last 12 months: no (last visit Oct 2019)   PMH of  1. DM: yes 2. Renal Disease/Calculi: no 3. Urinary Tract Abnormality: no  4. Instrumentation/Trauma: no 5. Immunosuppression: no 6. Pregnant no   The following portions of the patient's history were reviewed and updated as appropriate: past medical history, past surgical history, family history, social history, allergies, medications, and problem list.   ROS: see HPI   Patient Active Problem List   Diagnosis Date Noted   Pain in right knee 04/03/2023   Localized edema 04/03/2023   Hepatic steatosis 03/19/2023   Unilateral primary osteoarthritis, right knee 03/16/2023   Generalized osteoarthritis 02/12/2022   Arthritis of left knee 05/15/2021   Impacted cerumen of both ears 05/15/2021   Iron deficiency anemia 11/20/2020   History of adenomatous polyp of colon 11/20/2020   B12 deficiency 04/05/2020   Vitamin D deficiency 01/21/2020   Type 2 diabetes mellitus without complication, without long-term current use of insulin (HCC) 08/17/2019   Graves disease 06/15/2015   Elevated LFTs 06/07/2015   Obesity 06/07/2015   Osteoarthrosis 06/07/2015   Breast pain, left 09/17/2014   Hepatomegaly 03/09/2012   History  of elevated antinuclear antibody (ANA) 08/14/2010   Past Medical History:  Diagnosis Date   ANA positive    Anemia    COVID-19 12/2018   Diabetes mellitus without complication (HCC)    Hepatomegaly    History of chicken pox    History of gestational diabetes 06/07/2015   Hypothyroidism    Infected cyst of skin 1999   Osteoarthritis    Vitamin D deficiency    Past Surgical History:  Procedure Laterality Date   CESAREAN SECTION  07/03/2005   COLONOSCOPY WITH PROPOFOL N/A 06/28/2015   Procedure: COLONOSCOPY WITH PROPOFOL;  Surgeon: Earline Mayotte, MD;  Location: [facility] ENDOSCOPY;  Service: Endoscopy;  Laterality: N/A;   COLONOSCOPY WITH PROPOFOL N/A 03/13/2021   Procedure: COLONOSCOPY WITH PROPOFOL;  Surgeon: Wyline Mood, MD;  Location: [facility] ENDOSCOPY;  Service: Gastroenterology;  Laterality: N/A;   ESOPHAGOGASTRODUODENOSCOPY N/A 03/13/2021   Procedure: ESOPHAGOGASTRODUODENOSCOPY (EGD);  Surgeon: Wyline Mood, MD;  Location: [facility] ENDOSCOPY;  Service: Gastroenterology;  Laterality: N/A;   ESOPHAGOGASTRODUODENOSCOPY (EGD) WITH PROPOFOL N/A 06/13/2021   Procedure: ESOPHAGOGASTRODUODENOSCOPY (EGD) WITH PROPOFOL with CAPSULE PLACEMENT;  Surgeon: Wyline Mood, MD;  Location: [facility] ENDOSCOPY;  Service: Gastroenterology;  Laterality: N/A;  WITH GIVENS CAPSULE PLACEMENT   GIVENS CAPSULE STUDY N/A 06/13/2021   Procedure: GIVENS CAPSULE STUDY;  Surgeon: Wyline Mood, MD;  Location: [facility] ENDOSCOPY;  Service: Gastroenterology;  Laterality: N/A;   LIPOMA EXCISION  2003   mid back/ Dr Lemar Livings   pap smear  12/28/1998   Normala gain in 2023   ultrasound  03/09/2012   Hepayomegaly. Liver= 20.6cm   Family History  Problem Relation Age of Onset   Diabetes Mother        type 2   Cancer Brother 68       bone passed age 38   Diabetes Father    Dementia Maternal Grandmother    Brain cancer Maternal Grandmother    Congestive Heart Failure Maternal Grandfather    Dementia Paternal Grandmother    Stroke  Paternal Grandfather    Breast cancer Neg Hx    Outpatient Medications Prior to Visit  Medication Sig Dispense Refill   cyanocobalamin (VITAMIN B12) 1000 MCG/ML injection INJECT 1 ML INTRAMUSCULARLY ONCE EVERY MONTH 3 mL 3   hydrochlorothiazide (HYDRODIURIL) 25 MG tablet TAKE 1 TABLET BY MOUTH EVERY DAY 90 tablet 4   Insulin Syringe-Needle U-100 25G X 1" 1 ML MISC Use monthly for vitamin B12 injections 12 each 1   metFORMIN (GLUCOPHAGE) 500 MG tablet Take 1 tablet (500 mg total) by mouth 3 (three) times daily. 90 tablet 4   pravastatin (PRAVACHOL) 20 MG tablet Take 1 tablet (20 mg total) by mouth daily. 90 tablet 2   Semaglutide,0.25 or 0.5MG /DOS, (OZEMPIC, 0.25 OR 0.5 MG/DOSE,) 2 MG/3ML SOPN Inject 0.25mg  once a week for 4 weeks, then increase to 0.5mg  once a week 3 mL 2   SYRINGE-NEEDLE, DISP, 3 ML (B-D 3CC LUER-LOK SYR 25GX1") 25G X 1" 3 ML MISC USE MONTHLY FOR VITAMIN B12 INJECTIONS 3 each 4   terbinafine (LAMISIL) 250 MG tablet TAKE 2 TABLETS (500 MG TOTAL) BY MOUTH DAILY. TAKE FOR 1 WEEK OUT OF EVERY 4 WEEKS 42 tablet 1   Vitamin D, Ergocalciferol, (DRISDOL) 1.25 MG (50000 UNIT) CAPS capsule TAKE 1 CAPSULE BY MOUTH ONE TIME PER WEEK 12 capsule 4   No facility-administered medications prior to visit.   Allergies  Allergen Reactions   Misc. Sulfonamide Containing Compounds Itching   Amoxicillin Itching   Sulfa Antibiotics Itching and Swelling   ROS: A complete ROS was performed with pertinent positives/negatives noted in the HPI. The remainder of the ROS are negative.    Objective:   Today's Vitals   05/12/23 1536  BP: 129/83  Pulse: 88  Temp: 97.8 F (36.6 C)  TempSrc: Oral  SpO2: 95%  Weight: 192 lb 6.4 oz (87.3 kg)  Height: 5' (1.524 m)  PainSc: 2   PainLoc: Vagina   GENERAL: Well-appearing, in NAD. Well nourished.  SKIN: Pink, warm and dry. No rash, lesion, ulceration, or ecchymoses.  Head: Normocephalic. NECK: Trachea midline. Full ROM w/o pain or tenderness. No  lymphadenopathy.  EARS: Tympanic membranes are intact, translucent without bulging and without drainage. Appropriate landmarks visualized.  EYES: Conjunctiva clear without exudates. EOMI, PERRL, no drainage present.  NOSE: Septum midline w/o deformity. Nares patent, mucosa pink and non-inflamed w/o drainage. No sinus tenderness.  THROAT: Uvula midline. Oropharynx clear. Tonsils non-inflamed without exudate. Mucous membranes pink and moist.  RESPIRATORY: Chest wall symmetrical. Respirations even and non-labored. Breath sounds clear to auscultation bilaterally.  CARDIAC: S1, S2 present, regular rate and rhythm without murmur or gallops. Peripheral pulses 2+ bilaterally.  MSK: Muscle tone and strength appropriate for age. Joints w/o tenderness, redness, or swelling.  EXTREMITIES: Without clubbing, cyanosis, or edema.  NEUROLOGIC: No motor or sensory deficits. Steady, even gait. C2-C12 intact.  PSYCH/MENTAL STATUS: Alert, oriented x 3. Cooperative, appropriate mood and affect.   Results for orders placed or performed in visit on 05/12/23  POCT Urinalysis Dipstick  Result  Value Ref Range   Color, UA yellow    Clarity, UA clear    Glucose, UA Negative Negative   Bilirubin, UA negative    Ketones, UA negative    Spec Grav, UA 1.020 1.010 - 1.025   Blood, UA small    pH, UA 7.0 5.0 - 8.0   Protein, UA Negative Negative   Urobilinogen, UA 1.0 0.2 or 1.0 E.U./dL   Nitrite, UA positive    Leukocytes, UA Large (3+) (A) Negative   Appearance clear    Odor n/a       Assessment & Plan:   1. Urinary frequency (Primary) Kimberly Hancock is a 58 year old female patient who presents today with concerns regarding urinary symptoms. She reports urinary frequency, incontinence, and dysuria started yesterday evening. POCT UA performed with positive leukocytes, nitrites, and blood. Appears well, in no apparent distress.  Vital signs are normal. The abdomen is soft without tenderness, guarding, mass, rebound or  organomegaly. No CVA tenderness or inguinal adenopathy noted. Urine dipstick shows positive for leukocytes, nitrites and blood. Will treat for urinary tract infection. Advised patient to adequately hydrate. Reviewed allergies and recent antibiotic use. Ordered Macrobid BID x5 days. Advised patient if she develops fever, body aches, chills, upper back pain, fatigue/malaise, nausea/vomiting, and any other acute symptoms over the weekend to seek emergency care.    - POCT Urinalysis Dipstick - nitrofurantoin, macrocrystal-monohydrate, (MACROBID) 100 MG capsule; Take 1 capsule (100 mg total) by mouth 2 (two) times daily for 5 days.  Dispense: 10 capsule; Refill: 0  Meds ordered this encounter  Medications   nitrofurantoin, macrocrystal-monohydrate, (MACROBID) 100 MG capsule    Sig: Take 1 capsule (100 mg total) by mouth 2 (two) times daily for 5 days.    Dispense:  10 capsule    Refill:  0    Supervising Provider:   Alease Medina [[id number]]   Lab Orders         POCT Urinalysis Dipstick      Return if symptoms worsen or fail to improve.   Patient to reach out to office if new, worrisome, or unresolved symptoms arise or if no improvement in patient's condition. Patient verbalized understanding and is agreeable to treatment plan. All questions answered to patient's satisfaction.    Alyson Reedy, FNP

## 2023-05-12 NOTE — Telephone Encounter (Signed)
 Copied from CRM [id number]. Topic: Clinical - Red Word Triage >> May 12, 2023  1:19 PM Carla L wrote: Red Word that prompted transfer to Nurse Triage: Possible UTI, pain & unable urinate.  Chief Complaint: urinary s/s Symptoms: pain with urination, frequency, urgency Frequency: yesterday Pertinent Negatives: Patient denies fever Disposition: [] ED /[] Urgent Care (no appt availability in office) / [x] Appointment(In office/virtual)/ []  [place] [facility]/ [] Home Care/ [] Refused Recommended Disposition /[] [place] [facility]/ []  Follow-up with PCP Additional Notes: started taking AZO to help but would like to come in to get a Rx to get relief  Reason for Disposition  Urinating more frequently than usual (i.e., frequency)  Answer Assessment - Initial Assessment Questions 1. SYMPTOM: "What's the main symptom you're concerned about?" (e.g., frequency, incontinence)     Frequency and pain with urination 2. ONSET: "When did the    start?"     yesterday 3. PAIN: "Is there any pain?" If Yes, ask: "How bad is it?" (Scale: 1-10; mild, moderate, severe)     2 or 3/10 to 4 or 5/10 4. CAUSE: "What do you think is causing the symptoms?"     Pain, urgency, frequency with urination 5. OTHER SYMPTOMS: "Do you have any other symptoms?" (e.g., blood in urine, fever, flank pain, pain with urination)     no 6. PREGNANCY: "Is there any chance you are pregnant?" "When was your last menstrual period?"     N/a  Protocols used: Urinary Symptoms-A-AH

## 2023-05-13 NOTE — Telephone Encounter (Signed)
 Spoke with patient, she was offered appt at [facility] and was seen there for UTI on 05/12/23.

## 2023-05-13 NOTE — Telephone Encounter (Signed)
 Needs office visit.

## 2023-06-24 ENCOUNTER — Ambulatory Visit: Payer: Self-pay | Admitting: Family Medicine

## 2023-06-25 ENCOUNTER — Ambulatory Visit: Payer: Self-pay | Admitting: Family Medicine

## 2023-07-03 ENCOUNTER — Encounter: Payer: Self-pay | Admitting: Oncology

## 2023-07-03 ENCOUNTER — Ambulatory Visit: Payer: Self-pay | Admitting: Physician Assistant

## 2023-07-03 ENCOUNTER — Encounter: Payer: Self-pay | Admitting: Physician Assistant

## 2023-07-03 VITALS — BP 123/74 | HR 88 | Resp 16 | Ht 60.0 in

## 2023-07-03 DIAGNOSIS — R7989 Other specified abnormal findings of blood chemistry: Secondary | ICD-10-CM

## 2023-07-03 DIAGNOSIS — E785 Hyperlipidemia, unspecified: Secondary | ICD-10-CM

## 2023-07-03 DIAGNOSIS — E66813 Obesity, class 3: Secondary | ICD-10-CM | POA: Diagnosis not present

## 2023-07-03 DIAGNOSIS — R600 Localized edema: Secondary | ICD-10-CM

## 2023-07-03 DIAGNOSIS — K760 Fatty (change of) liver, not elsewhere classified: Secondary | ICD-10-CM

## 2023-07-03 DIAGNOSIS — E119 Type 2 diabetes mellitus without complications: Secondary | ICD-10-CM

## 2023-07-03 DIAGNOSIS — Z7984 Long term (current) use of oral hypoglycemic drugs: Secondary | ICD-10-CM

## 2023-07-03 DIAGNOSIS — E1169 Type 2 diabetes mellitus with other specified complication: Secondary | ICD-10-CM

## 2023-07-03 DIAGNOSIS — E1159 Type 2 diabetes mellitus with other circulatory complications: Secondary | ICD-10-CM

## 2023-07-03 DIAGNOSIS — I152 Hypertension secondary to endocrine disorders: Secondary | ICD-10-CM

## 2023-07-03 NOTE — Progress Notes (Signed)
 Established patient visit  Patient: Kimberly Hancock   DOB: [birth year]   58 y.o. Female  MRN: [id number] Visit Date: 07/03/2023  Today's healthcare provider: Blane Bunting, PA-C   Chief Complaint  Patient presents with   Follow-up    3 mth f/u no other concerns   Subjective       Discussed the use of AI scribe software for clinical note transcription with the patient, who gave verbal consent to proceed.  History of Present Illness Kimberly Hancock is a 58 year old female with diabetes, hyperlipidemia, and hypertension who presents for a follow-up visit.  She started semaglutide  0.5 mg once a week 14 weeks ago for elevated liver enzymes and weight management, resulting in a 10-pound weight loss since January. She experiences no side effects from semaglutide  but has less frequent bowel movements despite a high-fiber, low-carb diet and adequate hydration. Her last blood work was in January, and she is due for repeat testing to assess liver function, lipid levels, and A1c.  She takes metformin  for diabetes management. Her blood pressure and lipid levels are well-controlled with hydrochlorothiazide  25 mg and pravastatin  20 mg.  She experiences occasional leg swelling, particularly in the evenings, which she manages by elevating her legs. She experiences eye strain from prolonged computer use, attributed to her work environment. No neck tightness, hoarse voice, swallowing difficulties, breathing problems, dizziness, or current allergies.       07/03/2023    1:26 PM 03/18/2023    8:43 AM 05/31/2022    8:32 AM  Depression screen PHQ 2/9  Decreased Interest 0 0 0  Down, Depressed, Hopeless 0 0 0  PHQ - 2 Score 0 0 0  Altered sleeping 0  0  Tired, decreased energy 0  0  Change in appetite 0  0  Feeling bad or failure about yourself  0  0  Trouble concentrating 0  0  Moving slowly or fidgety/restless 0  0  Suicidal thoughts 0  0  PHQ-9 Score 0  0  Difficult doing work/chores Not difficult at  all  Not difficult at all      07/03/2023    1:28 PM 07/03/2023    1:27 PM 03/18/2023    8:43 AM  GAD 7 : Generalized Anxiety Score  Nervous, Anxious, on Edge 0 0 0  Control/stop worrying 0 0 0  Worry too much - different things 0 0 0  Trouble relaxing 0 0 0  Restless 0 0 0  Easily annoyed or irritable 0 0 0  Afraid - awful might happen 0 0 0  Total GAD 7 Score 0 0 0  Anxiety Difficulty Not difficult at all Not difficult at all Not difficult at all    Medications: Outpatient Medications Prior to Visit  Medication Sig   cyanocobalamin  (VITAMIN B12) 1000 MCG/ML injection INJECT 1 ML INTRAMUSCULARLY ONCE EVERY MONTH   hydrochlorothiazide  (HYDRODIURIL ) 25 MG tablet TAKE 1 TABLET BY MOUTH EVERY DAY   Insulin  Syringe-Needle U-100 25G X 1" 1 ML MISC Use monthly for vitamin B12 injections   metFORMIN  (GLUCOPHAGE ) 500 MG tablet Take 1 tablet (500 mg total) by mouth 3 (three) times daily.   pravastatin  (PRAVACHOL ) 20 MG tablet Take 1 tablet (20 mg total) by mouth daily.   Semaglutide ,0.25 or 0.5MG /DOS, (OZEMPIC , 0.25 OR 0.5 MG/DOSE,) 2 MG/3ML SOPN Inject 0.25mg  once a week for 4 weeks, then increase to 0.5mg  once a week   SYRINGE-NEEDLE, DISP, 3 ML (B-D 3CC LUER-LOK SYR 25GX1") 25G X  1" 3 ML MISC USE MONTHLY FOR VITAMIN B12 INJECTIONS   terbinafine  (LAMISIL ) 250 MG tablet TAKE 2 TABLETS (500 MG TOTAL) BY MOUTH DAILY. TAKE FOR 1 WEEK OUT OF EVERY 4 WEEKS   Vitamin D , Ergocalciferol , (DRISDOL) 1.25 MG (50000 UNIT) CAPS capsule TAKE 1 CAPSULE BY MOUTH ONE TIME PER WEEK   No facility-administered medications prior to visit.    Review of Systems  All other systems reviewed and are negative.  All negative Except see HPI       Objective    BP 123/74 (BP Location: Right Arm, Patient Position: Sitting, Cuff Size: Normal)   Pulse 88   Resp 16   Ht 5' (1.524 m)   LMP 06/07/2017   SpO2 99%   BMI 37.58 kg/m     Physical Exam Vitals reviewed.  Constitutional:      General: She is not in  acute distress.    Appearance: Normal appearance. She is well-developed. She is not diaphoretic.  HENT:     Head: Normocephalic and atraumatic.  Eyes:     General: No scleral icterus.    Conjunctiva/sclera: Conjunctivae normal.  Neck:     Thyroid: No thyromegaly.  Cardiovascular:     Rate and Rhythm: Normal rate and regular rhythm.     Pulses: Normal pulses.     Heart sounds: Normal heart sounds. No murmur heard. Pulmonary:     Effort: Pulmonary effort is normal. No respiratory distress.     Breath sounds: Normal breath sounds. No wheezing, rhonchi or rales.  Musculoskeletal:     Cervical back: Neck supple.     Right lower leg: No edema.     Left lower leg: No edema.  Lymphadenopathy:     Cervical: No cervical adenopathy.  Skin:    General: Skin is warm and dry.     Findings: No rash.  Neurological:     Mental Status: She is alert and oriented to person, place, and time. Mental status is at baseline.  Psychiatric:        Mood and Affect: Mood normal.        Behavior: Behavior normal.     No results found for any visits on 07/03/23.      Assessment & Plan Type 2 diabetes mellitus  - Lipid panel - Hemoglobin A1c - TSH - Comprehensive metabolic panel with GFR - Microalbumin / creatinine urine ratio  Hyperlipidemia - Lipid panel - Hemoglobin A1c - TSH - Comprehensive metabolic panel with GFR  Obesity  Chronic Body mass index is 37.58 kg/m. Weight loss of 5% of pt's current weight via healthy diet and daily exercise encouraged. Continue semaglutide  Will follow-up  Hypertension associated with diabetes (HCC) - Lipid panel - Hemoglobin A1c - TSH - Comprehensive metabolic panel with GFR - Microalbumin / creatinine urine ratio   Type 2 diabetes mellitus Chronic Last A1c Diabetes well-managed with metformin  and semaglutide . Noted 10-pound weight loss with semaglutide . Discussed importance of steady weight loss to prevent complications. - Order A1c  test. Continue low carb diet and exercise Will follow-up with pcp  Elevated liver enzymes/Fatty liver Semaglutide  initiated for reducing liver fat content, liver stiffness, and improving liver enzymes . Need to assess liver function to evaluate semaglutide  effectiveness. - Order liver function tests. Will follow-up  Hypertension Chronic and stable Blood pressure well-controlled with hydrochlorothiazide . Continue low sodium intake and exercise Will follow-up  Hyperlipidemia Chronic and previously Hyperlipidemia well-controlled with pravastatin . - Order lipid panel. Continue low cholesterol diet and exercise  Will follow-up  Peripheral edema Occasional evening swelling. No specific treatment other than leg elevation. - Recommend compression stockings, starting with 15 mmHg if 30 mmHg is not tolerated. - Advise elevating legs when sitting.  No orders of the defined types were placed in this encounter.   No follow-ups on file.   The patient was advised to call back or seek an in-person evaluation if the symptoms worsen or if the condition fails to improve as anticipated.  I discussed the assessment and treatment plan with the patient. The patient was provided an opportunity to ask questions and all were answered. The patient agreed with the plan and demonstrated an understanding of the instructions.  I, Bassel Gaskill, PA-C have reviewed all documentation for this visit. The documentation on 07/03/2023  for the exam, diagnosis, procedures, and orders are all accurate and complete.  Blane Bunting, [facility], MMS [facility] [phone] (phone) [phone] (fax)  [facility]

## 2023-07-06 LAB — COMPREHENSIVE METABOLIC PANEL WITH GFR
ALT: 60 IU/L — ABNORMAL HIGH (ref 0–32)
AST: 44 IU/L — ABNORMAL HIGH (ref 0–40)
Albumin: 5 g/dL — ABNORMAL HIGH (ref 3.8–4.9)
Alkaline Phosphatase: 82 IU/L (ref 44–121)
BUN/Creatinine Ratio: 19 (ref 9–23)
BUN: 13 mg/dL (ref 6–24)
Bilirubin Total: 0.4 mg/dL (ref 0.0–1.2)
CO2: 24 mmol/L (ref 20–29)
Calcium: 10.4 mg/dL — ABNORMAL HIGH (ref 8.7–10.2)
Chloride: 98 mmol/L (ref 96–106)
Creatinine, Ser: 0.69 mg/dL (ref 0.57–1.00)
Globulin, Total: 2.4 g/dL (ref 1.5–4.5)
Glucose: 84 mg/dL (ref 70–99)
Potassium: 4.9 mmol/L (ref 3.5–5.2)
Sodium: 141 mmol/L (ref 134–144)
Total Protein: 7.4 g/dL (ref 6.0–8.5)
eGFR: 101 mL/min/{1.73_m2} (ref >59)

## 2023-07-06 LAB — HEMOGLOBIN A1C
Est. average glucose Bld gHb Est-mCnc: 117 mg/dL
Hgb A1c MFr Bld: 5.7 % — ABNORMAL HIGH (ref 4.8–5.6)

## 2023-07-06 LAB — MICROALBUMIN / CREATININE URINE RATIO: Creatinine, Urine: 21.7 mg/dL

## 2023-07-06 LAB — LIPID PANEL
Chol/HDL Ratio: 2.8 ratio (ref 0.0–4.4)
Cholesterol, Total: 186 mg/dL (ref 100–199)
HDL: 67 mg/dL (ref >39)
LDL Chol Calc (NIH): 100 mg/dL — ABNORMAL HIGH (ref 0–99)
Triglycerides: 106 mg/dL (ref 0–149)
VLDL Cholesterol Cal: 19 mg/dL (ref 5–40)

## 2023-07-06 LAB — TSH: TSH: 2.3 u[IU]/mL (ref 0.450–4.500)

## 2023-07-07 ENCOUNTER — Encounter: Payer: Self-pay | Admitting: Physician Assistant

## 2023-07-07 ENCOUNTER — Other Ambulatory Visit: Payer: Self-pay | Admitting: Family Medicine

## 2023-07-07 DIAGNOSIS — K760 Fatty (change of) liver, not elsewhere classified: Secondary | ICD-10-CM

## 2023-07-07 DIAGNOSIS — E119 Type 2 diabetes mellitus without complications: Secondary | ICD-10-CM

## 2023-08-05 ENCOUNTER — Other Ambulatory Visit: Payer: Self-pay | Admitting: Family Medicine

## 2023-08-05 DIAGNOSIS — E559 Vitamin D deficiency, unspecified: Secondary | ICD-10-CM

## 2023-08-10 ENCOUNTER — Other Ambulatory Visit: Payer: Self-pay | Admitting: Family Medicine

## 2023-08-10 DIAGNOSIS — E119 Type 2 diabetes mellitus without complications: Secondary | ICD-10-CM

## 2023-10-20 ENCOUNTER — Ambulatory Visit: Admitting: Family Medicine

## 2023-10-20 VITALS — BP 125/74 | HR 73 | Ht 60.0 in | Wt 192.0 lb

## 2023-10-20 DIAGNOSIS — E1159 Type 2 diabetes mellitus with other circulatory complications: Secondary | ICD-10-CM | POA: Diagnosis not present

## 2023-10-20 DIAGNOSIS — Z7985 Long-term (current) use of injectable non-insulin antidiabetic drugs: Secondary | ICD-10-CM | POA: Diagnosis not present

## 2023-10-20 DIAGNOSIS — I152 Hypertension secondary to endocrine disorders: Secondary | ICD-10-CM

## 2023-10-20 DIAGNOSIS — H00012 Hordeolum externum right lower eyelid: Secondary | ICD-10-CM

## 2023-10-20 DIAGNOSIS — K760 Fatty (change of) liver, not elsewhere classified: Secondary | ICD-10-CM | POA: Diagnosis not present

## 2023-10-20 DIAGNOSIS — E119 Type 2 diabetes mellitus without complications: Secondary | ICD-10-CM

## 2023-10-20 LAB — POCT GLYCOSYLATED HEMOGLOBIN (HGB A1C): Hemoglobin A1C: 5.7 % — AB (ref 4.0–5.6)

## 2023-10-20 MED ORDER — CIPROFLOXACIN HCL 0.3 % OP SOLN: 1.0000 [drp] | OPHTHALMIC | 0 refills | Status: AC

## 2023-10-20 NOTE — Progress Notes (Signed)
 Established patient visit   Patient: Kimberly Hancock   DOB: 06-25-1965   59 y.o. Female  MRN: [id number] Visit Date: 10/20/2023  Today's healthcare provider: Nancyann Perry, MD   Chief Complaint  Patient presents with   Diabetes   Hypertension   Abnormal labs    Patient had abnormal levels of liver enzymes lab draw   Subjective    Discussed the use of AI scribe software for clinical note transcription with the patient, who gave verbal consent to proceed.  History of Present Illness   Kimberly Hancock is a 58 year old female with type 2 diabetes who presents for a follow-up on her blood sugar management.  She is currently taking Ozempic  at a dose of 0.5 mg and metformin . She does not monitor her blood sugar at home, but her last A1c was 5.7. She reports occasional constipation, which is not much different from her normal experience.  At the 0.25 mg dose of Ozempic , she could not tell she was taking it, but at 0.5 mg, she feels full faster, which has helped her manage her portions. She initially lost about 12 pounds, but her weight has since stabilized. She has not experienced nausea or cramping with her current medication regimen.  She mentions an issue with her eye, suspecting a stye, which started on Saturday. Wearing her contacts on Sunday aggravated the condition, causing discomfort. She finds it challenging at work due to needing bifocals to see the computer screen, which requires her to adjust her head position frequently.       Medications: Outpatient Medications Prior to Visit  Medication Sig   cyanocobalamin  (VITAMIN B12) 1000 MCG/ML injection INJECT 1 ML INTRAMUSCULARLY ONCE EVERY MONTH   hydrochlorothiazide  (HYDRODIURIL ) 25 MG tablet TAKE 1 TABLET BY MOUTH EVERY DAY   Insulin  Syringe-Needle U-100 25G X 1 1 ML MISC Use monthly for vitamin B12 injections   metFORMIN  (GLUCOPHAGE ) 500 MG tablet TAKE 1 TABLET BY MOUTH THREE TIMES A DAY   pravastatin  (PRAVACHOL ) 20 MG  tablet Take 1 tablet (20 mg total) by mouth daily.   Semaglutide ,0.25 or 0.5MG /DOS, (OZEMPIC , 0.25 OR 0.5 MG/DOSE,) 2 MG/3ML SOPN Inject 0.5 mg as directed once a week.   SYRINGE-NEEDLE, DISP, 3 ML (B-D 3CC LUER-LOK SYR 25GX1) 25G X 1 3 ML MISC USE MONTHLY FOR VITAMIN B12 INJECTIONS   Vitamin D , Ergocalciferol , (DRISDOL) 1.25 MG (50000 UNIT) CAPS capsule TAKE 1 CAPSULE BY MOUTH ONE TIME PER WEEK   No facility-administered medications prior to visit.   Review of Systems  Constitutional:  Negative for appetite change, chills, fatigue and fever.  Respiratory:  Negative for chest tightness and shortness of breath.   Cardiovascular:  Negative for chest pain and palpitations.  Gastrointestinal:  Negative for abdominal pain, nausea and vomiting.  Neurological:  Negative for dizziness and weakness.       Objective    BP 125/74 (BP Location: Left Arm, Patient Position: Sitting, Cuff Size: Normal)   Pulse 73   Ht 5' (1.524 m)   Wt 192 lb (87.1 kg)   LMP 06/07/2017   SpO2 99%   BMI 37.50 kg/m   Physical Exam   General appearance: Obese female, cooperative and in no acute distress Head: Normocephalic, without obvious abnormality, atraumatic. Stye of right lower eyelid Respiratory: Respirations even and unlabored, normal respiratory rate Extremities: All extremities are intact.  Skin: Skin color, texture, turgor normal. No rashes seen  Psych: Appropriate mood and affect. Neurologic: Mental status:  Alert, oriented to person, place, and time, thought content appropriate.   Results for orders placed or performed in visit on 10/20/23  POCT glycosylated hemoglobin (Hb A1C)  Result Value Ref Range   Hemoglobin A1C 5.7 (A) 4.0 - 5.6 %     Assessment & Plan       Type 2 diabetes mellitus Type 2 diabetes mellitus with A1c at 5.7. On low dose Ozempic  and metformin . Weight loss plateaued. Increasing Ozempic  expected to improve glycemic control and aid weight loss. - Increase Ozempic  dose  to 1 mg to improve glycemic control and potentially aid in further weight loss. - Continue metformin  as prescribed. - Send prescription for Ozempic  at the end of the week for pickup on September 4th.  Stye of eye Stye likely exacerbated by contact lens use. Symptoms include discomfort and irritation. - Send prescription for antibiotic eye drops to address potential infection.   Fatty liver  Transaminases have been improving since being on semaglutide .   Fibrosis 4 Score = 1.19 (Low risk)        Interpretation for patients with NAFLD          <1.30       -  F0-F1 (Low risk)          1.30-2.67 -  Indeterminate           >2.67      -  F3-F4 (High risk)     Validated for ages 50-65      Score is based on outdated labs. ALT, AST, and platelets should all be measured within the last 6 months for an accurate FIB-4 Score    Return in about 20 weeks (around 03/08/2024) for Diabetes.     Nancyann Perry, MD  [facility] [phone] (phone) [phone] (fax)  [facility]

## 2023-10-20 NOTE — Patient Instructions (Signed)
 [name]  Please review the attached list of medications and notify my office if there are any errors.   . Please bring all of your medications to every appointment so we can make sure that our medication list is the same as yours.

## 2023-10-24 ENCOUNTER — Other Ambulatory Visit: Payer: Self-pay | Admitting: Family Medicine

## 2023-10-24 DIAGNOSIS — E1159 Type 2 diabetes mellitus with other circulatory complications: Secondary | ICD-10-CM | POA: Insufficient documentation

## 2023-10-24 DIAGNOSIS — E119 Type 2 diabetes mellitus without complications: Secondary | ICD-10-CM

## 2023-10-24 DIAGNOSIS — E538 Deficiency of other specified B group vitamins: Secondary | ICD-10-CM

## 2023-10-24 MED ORDER — HYDROCHLOROTHIAZIDE 25 MG PO TABS
25.0000 mg | ORAL_TABLET | Freq: Every day | ORAL | 4 refills | Status: AC
Start: 2023-10-24 — End: ?

## 2023-10-24 MED ORDER — CYANOCOBALAMIN 1000 MCG/ML IJ SOLN: INTRAMUSCULAR | 4 refills | Status: AC

## 2023-10-24 MED ORDER — SEMAGLUTIDE (1 MG/DOSE) 4 MG/3ML ~~LOC~~ SOPN: 1.0000 mg | PEN_INJECTOR | SUBCUTANEOUS | 4 refills | Status: DC

## 2023-10-24 NOTE — Addendum Note (Signed)
 Addended by: GASPER NANCYANN BRAVO on: 10/24/2023 01:04 PM   Modules accepted: Orders

## 2023-10-28 ENCOUNTER — Telehealth: Payer: Self-pay

## 2023-10-28 ENCOUNTER — Telehealth: Payer: Self-pay | Admitting: Family Medicine

## 2023-10-28 NOTE — Telephone Encounter (Signed)
 Kimberly Hancock

## 2023-10-28 NOTE — Telephone Encounter (Signed)
 Copied from CRM #[id number]. Topic: General - Other >> Oct 28, 2023 11:40 AM Zebedee SAUNDERS wrote: Reason for CRM: Pt saw in MyChart lab orders on 10/24/2023 she would like a call back [phone] to discuss details for labs.

## 2023-11-27 ENCOUNTER — Other Ambulatory Visit: Payer: Self-pay

## 2023-11-27 DIAGNOSIS — Z1231 Encounter for screening mammogram for malignant neoplasm of breast: Secondary | ICD-10-CM

## 2023-12-22 ENCOUNTER — Ambulatory Visit
Admission: RE | Admit: 2023-12-22 | Discharge: 2023-12-22 | Disposition: A | Discharge status: Home / Self Care | Source: Ambulatory Visit

## 2023-12-22 DIAGNOSIS — Z1231 Encounter for screening mammogram for malignant neoplasm of breast: Secondary | ICD-10-CM | POA: Diagnosis present

## 2024-02-06 ENCOUNTER — Other Ambulatory Visit: Payer: Self-pay | Admitting: Family Medicine

## 2024-02-06 DIAGNOSIS — E119 Type 2 diabetes mellitus without complications: Secondary | ICD-10-CM

## 2024-03-01 ENCOUNTER — Ambulatory Visit: Admitting: Family Medicine

## 2024-03-01 ENCOUNTER — Encounter: Payer: Self-pay | Admitting: Family Medicine

## 2024-03-01 VITALS — BP 111/61 | Resp 16 | Ht 60.0 in | Wt 197.0 lb

## 2024-03-01 DIAGNOSIS — E538 Deficiency of other specified B group vitamins: Secondary | ICD-10-CM | POA: Diagnosis not present

## 2024-03-01 DIAGNOSIS — E1159 Type 2 diabetes mellitus with other circulatory complications: Secondary | ICD-10-CM | POA: Diagnosis not present

## 2024-03-01 DIAGNOSIS — E66812 Obesity, class 2: Secondary | ICD-10-CM | POA: Diagnosis not present

## 2024-03-01 DIAGNOSIS — I152 Hypertension secondary to endocrine disorders: Secondary | ICD-10-CM

## 2024-03-01 DIAGNOSIS — E119 Type 2 diabetes mellitus without complications: Secondary | ICD-10-CM

## 2024-03-01 DIAGNOSIS — E559 Vitamin D deficiency, unspecified: Secondary | ICD-10-CM

## 2024-03-01 DIAGNOSIS — Z6838 Body mass index (BMI) 38.0-38.9, adult: Secondary | ICD-10-CM | POA: Diagnosis not present

## 2024-03-01 DIAGNOSIS — K760 Fatty (change of) liver, not elsewhere classified: Secondary | ICD-10-CM | POA: Diagnosis not present

## 2024-03-01 LAB — POCT GLYCOSYLATED HEMOGLOBIN (HGB A1C): Hemoglobin A1C: 5.7 % — AB (ref 4.0–5.6)

## 2024-03-01 NOTE — Patient Instructions (Signed)
 [name]  Please review the attached list of medications and notify my office if there are any errors.   . Please bring all of your medications to every appointment so we can make sure that our medication list is the same as yours.

## 2024-03-01 NOTE — Progress Notes (Signed)
 "     Established patient visit   Patient: Kimberly Hancock   DOB: [birth year]   59 y.o. Female  MRN: [id number] Visit Date: 03/01/2024  Today's healthcare provider: Nancyann Perry, MD   Chief Complaint  Patient presents with   Follow-up   Diabetes    4 mth f/u DM   Subjective    Discussed the use of AI scribe software for clinical note transcription with the patient, who gave verbal consent to proceed.  History of Present Illness   Kimberly Hancock is a 59 year old female with type 2 diabetes who presents for follow-up of her diabetes management.  Her A1c is currently 5.7. She is using Ozempic  for management, experiencing mild nausea on the first day of administration but no significant changes with the higher dose. Blood sugar levels and weight have remained stable.  She has a history of liver issues and started Ozempic  for this reason. She is concerned about maintaining these improvements and is cautious about changing medications.  She is taking a vitamin D  supplement once a week and receives a B12 injection once a month. She recently refilled her prescriptions for a three-month supply.     Lab Results  Component Value Date   HGBA1C 5.7 (A) 03/01/2024   HGBA1C 5.7 (A) 10/20/2023   HGBA1C 5.7 (H) 07/03/2023   Lab Results  Component Value Date   TSH 2.300 07/03/2023   Lab Results  Component Value Date   VITAMINB12 491 03/18/2023   Lab Results  Component Value Date   VD25OH 75.5 03/18/2023     Medications: Show/hide medication list[1] Review of Systems  Constitutional:  Negative for appetite change, chills, fatigue and fever.  Respiratory:  Negative for chest tightness and shortness of breath.   Cardiovascular:  Negative for chest pain and palpitations.  Gastrointestinal:  Negative for abdominal pain, nausea and vomiting.  Neurological:  Negative for dizziness and weakness.       Objective    BP 111/61 (BP Location: Left Arm, Patient Position: Sitting, Cuff  Size: Normal)   Resp 16   Ht 5' (1.524 m)   Wt 197 lb (89.4 kg)   LMP 06/07/2017   SpO2 100%   BMI 38.47 kg/m   Physical Exam   General: Appearance:    Mildly obese female in no acute distress  Eyes:    PERRL, conjunctiva/corneas clear, EOM's intact       Lungs:     Clear to auscultation bilaterally, respirations unlabored  Heart:    Normal heart rate. Normal rhythm. No murmurs, rubs, or gallops.    MS:   All extremities are intact.    Neurologic:   Awake, alert, oriented x 3. No apparent focal neurological defect.         Results for orders placed or performed in visit on 03/01/24  POCT glycosylated hemoglobin (Hb A1C)  Result Value Ref Range   Hemoglobin A1C 5.7 (A) 4.0 - 5.6 %    Assessment & Plan     1. Type 2 diabetes mellitus without complication, without long-term current use of insulin  (HCC) (Primary) Well controlled but having a bit of nausea with Ozempic . Discussed option of changing to Mounjaro, but benefits for fatty liver or not quite as well established.  - Lipid panel - TSH  2. Hypertension associated with diabetes (HCC) Well controlled.  Continue current medications.   - TSH  3. Fatty liver Transaminases have been improved. Continue semaglutide  for now.  - Comprehensive metabolic  panel with GFR  4. B12 deficiency On monthly B12 injections.  - Vitamin B12  5. Vitamin D  deficiency Taking vitamin d  supplements consistently.  - VITAMIN D  25 Hydroxy (Vit-D Deficiency, Fractures)  6. Class 2 severe obesity with serious comorbidity and body mass index (BMI) of 38.0 to 38.9 in adult, unspecified obesity type  Future Appointments  Date Time Provider Department Center  08/30/2024  8:40 AM Gasper, Nancyann BRAVO, MD [facility] Nancyann Gasper, MD  [facility] [phone] (phone) [phone] (fax)  [facility].    [1]  Outpatient Medications Prior to Visit  Medication Sig   cyanocobalamin   (VITAMIN B12) 1000 MCG/ML injection Inject 1 ml intramuscularly once every month   hydrochlorothiazide  (HYDRODIURIL ) 25 MG tablet Take 1 tablet (25 mg total) by mouth daily.   Insulin  Syringe-Needle U-100 25G X 1 1 ML MISC Use monthly for vitamin B12 injections   metFORMIN  (GLUCOPHAGE ) 500 MG tablet TAKE 1 TABLET BY MOUTH THREE TIMES A DAY   pravastatin  (PRAVACHOL ) 20 MG tablet TAKE 1 TABLET BY MOUTH EVERY DAY   Semaglutide , 1 MG/DOSE, 4 MG/3ML SOPN Inject 1 mg as directed once a week.   SYRINGE-NEEDLE, DISP, 3 ML (B-D 3CC LUER-LOK SYR 25GX1) 25G X 1 3 ML MISC USE MONTHLY FOR VITAMIN B12 INJECTIONS   Vitamin D , Ergocalciferol , (DRISDOL) 1.25 MG (50000 UNIT) CAPS capsule TAKE 1 CAPSULE BY MOUTH ONE TIME PER WEEK   No facility-administered medications prior to visit.   "

## 2024-03-02 ENCOUNTER — Ambulatory Visit: Payer: Self-pay | Admitting: Family Medicine

## 2024-03-02 LAB — COMPREHENSIVE METABOLIC PANEL WITH GFR
ALT: 65 IU/L — ABNORMAL HIGH (ref 0–32)
AST: 39 IU/L (ref 0–40)
Albumin: 4.3 g/dL (ref 3.8–4.9)
Alkaline Phosphatase: 84 IU/L (ref 49–135)
BUN/Creatinine Ratio: 17 (ref 9–23)
BUN: 13 mg/dL (ref 6–24)
Bilirubin Total: 0.4 mg/dL (ref 0.0–1.2)
CO2: 24 mmol/L (ref 20–29)
Calcium: 9.5 mg/dL (ref 8.7–10.2)
Chloride: 102 mmol/L (ref 96–106)
Creatinine, Ser: 0.75 mg/dL (ref 0.57–1.00)
Globulin, Total: 2.5 g/dL (ref 1.5–4.5)
Glucose: 144 mg/dL — ABNORMAL HIGH (ref 70–99)
Potassium: 4.8 mmol/L (ref 3.5–5.2)
Sodium: 139 mmol/L (ref 134–144)
Total Protein: 6.8 g/dL (ref 6.0–8.5)
eGFR: 92 mL/min/1.73

## 2024-03-02 LAB — LIPID PANEL
Chol/HDL Ratio: 3 ratio (ref 0.0–4.4)
Cholesterol, Total: 169 mg/dL (ref 100–199)
HDL: 56 mg/dL
LDL Chol Calc (NIH): 93 mg/dL (ref 0–99)
Triglycerides: 109 mg/dL (ref 0–149)
VLDL Cholesterol Cal: 20 mg/dL (ref 5–40)

## 2024-03-02 LAB — VITAMIN D 25 HYDROXY (VIT D DEFICIENCY, FRACTURES): Vit D, 25-Hydroxy: 72 ng/mL (ref 30.0–100.0)

## 2024-03-02 LAB — VITAMIN B12: Vitamin B-12: 286 pg/mL (ref 232–1245)

## 2024-03-02 LAB — TSH: TSH: 3.27 u[IU]/mL (ref 0.450–4.500)

## 2024-03-07 ENCOUNTER — Other Ambulatory Visit: Payer: Self-pay | Admitting: Family Medicine

## 2024-03-07 DIAGNOSIS — E119 Type 2 diabetes mellitus without complications: Secondary | ICD-10-CM

## 2024-08-30 ENCOUNTER — Ambulatory Visit: Admitting: Family Medicine
